# Patient Record
Sex: Female | Born: 1960 | Race: Black or African American | Hispanic: No | Marital: Married | Smoking: Former smoker
Health system: Southern US, Community
[De-identification: ages and names within clinical notes are randomized; demographics above are authoritative.]

## PROBLEM LIST (undated history)

## (undated) DIAGNOSIS — E039 Hypothyroidism, unspecified: Secondary | ICD-10-CM

## (undated) DIAGNOSIS — M79606 Pain in leg, unspecified: Secondary | ICD-10-CM

## (undated) DIAGNOSIS — M25551 Pain in right hip: Secondary | ICD-10-CM

## (undated) DIAGNOSIS — A048 Other specified bacterial intestinal infections: Secondary | ICD-10-CM

## (undated) HISTORY — DX: Hypothyroidism, unspecified: E03.9

## (undated) HISTORY — DX: Pain in right hip: M25.551

## (undated) HISTORY — DX: Pain in leg, unspecified: M79.606

---

## 2012-08-17 ENCOUNTER — Ambulatory Visit: Payer: Self-pay | Admitting: Internal Medicine

## 2015-04-02 ENCOUNTER — Encounter: Payer: Self-pay | Admitting: Urgent Care

## 2015-04-02 ENCOUNTER — Emergency Department: Payer: BLUE CROSS/BLUE SHIELD

## 2015-04-02 ENCOUNTER — Emergency Department
Admission: EM | Admit: 2015-04-02 | Discharge: 2015-04-03 | Disposition: A | Discharge status: Home / Self Care | Payer: BLUE CROSS/BLUE SHIELD | Attending: Emergency Medicine | Admitting: Emergency Medicine

## 2015-04-02 DIAGNOSIS — R52 Pain, unspecified: Secondary | ICD-10-CM

## 2015-04-02 DIAGNOSIS — R109 Unspecified abdominal pain: Secondary | ICD-10-CM

## 2015-04-02 DIAGNOSIS — R1013 Epigastric pain: Secondary | ICD-10-CM | POA: Diagnosis present

## 2015-04-02 HISTORY — DX: Other specified bacterial intestinal infections: A04.8

## 2015-04-02 LAB — COMPREHENSIVE METABOLIC PANEL
ALT: 15 U/L (ref 14–54)
AST: 21 U/L (ref 15–41)
Albumin: 3.6 g/dL (ref 3.5–5.0)
Alkaline Phosphatase: 102 U/L (ref 38–126)
Anion gap: 6 (ref 5–15)
BILIRUBIN TOTAL: 0.2 mg/dL — AB (ref 0.3–1.2)
BUN: 13 mg/dL (ref 6–20)
CALCIUM: 8.9 mg/dL (ref 8.9–10.3)
CO2: 25 mmol/L (ref 22–32)
Chloride: 111 mmol/L (ref 101–111)
Creatinine, Ser: 0.89 mg/dL (ref 0.44–1.00)
GFR calc Af Amer: >60 mL/min (ref >60)
GFR calc non Af Amer: >60 mL/min (ref >60)
GLUCOSE: 96 mg/dL (ref 65–99)
POTASSIUM: 4 mmol/L (ref 3.5–5.1)
SODIUM: 142 mmol/L (ref 135–145)
Total Protein: 7 g/dL (ref 6.5–8.1)

## 2015-04-02 LAB — CBC WITH DIFFERENTIAL/PLATELET
Basophils Absolute: 0 10*3/uL (ref 0–0.1)
Basophils Relative: 1 %
Eosinophils Absolute: 0.8 10*3/uL — ABNORMAL HIGH (ref 0–0.7)
Eosinophils Relative: 25 %
HEMATOCRIT: 38.8 % (ref 35.0–47.0)
Hemoglobin: 13 g/dL (ref 12.0–16.0)
LYMPHS ABS: 1 10*3/uL (ref 1.0–3.6)
Lymphocytes Relative: 31 %
MCH: 28.8 pg (ref 26.0–34.0)
MCHC: 33.4 g/dL (ref 32.0–36.0)
MCV: 86 fL (ref 80.0–100.0)
Monocytes Absolute: 0.2 10*3/uL (ref 0.2–0.9)
Monocytes Relative: 6 %
Neutro Abs: 1.2 10*3/uL — ABNORMAL LOW (ref 1.4–6.5)
Neutrophils Relative %: 37 %
PLATELETS: 217 10*3/uL (ref 150–440)
RBC: 4.52 MIL/uL (ref 3.80–5.20)
RDW: 13.2 % (ref 11.5–14.5)
WBC: 3.3 10*3/uL — ABNORMAL LOW (ref 3.6–11.0)

## 2015-04-02 LAB — URINALYSIS COMPLETE WITH MICROSCOPIC (ARMC ONLY)
BILIRUBIN URINE: NEGATIVE
Bacteria, UA: NONE SEEN
Glucose, UA: NEGATIVE mg/dL
HGB URINE DIPSTICK: NEGATIVE
KETONES UR: NEGATIVE mg/dL
Leukocytes, UA: NEGATIVE
Nitrite: NEGATIVE
Protein, ur: NEGATIVE mg/dL
RBC / HPF: NONE SEEN RBC/hpf (ref 0–5)
Specific Gravity, Urine: 1.012 (ref 1.005–1.030)
pH: 7 (ref 5.0–8.0)

## 2015-04-02 LAB — LIPASE, BLOOD: LIPASE: 52 U/L — AB (ref 22–51)

## 2015-04-02 MED ORDER — ONDANSETRON HCL 4 MG/2ML IJ SOLN
4.0000 mg | Freq: Once | INTRAMUSCULAR | Status: AC
  Administered 2015-04-02: 4 mg via INTRAVENOUS

## 2015-04-02 MED ORDER — ONDANSETRON HCL 4 MG/2ML IJ SOLN
INTRAMUSCULAR | Status: AC
  Administered 2015-04-02: 4 mg via INTRAVENOUS
  Filled 2015-04-02: qty 2

## 2015-04-02 MED ORDER — MORPHINE SULFATE 4 MG/ML IJ SOLN
INTRAMUSCULAR | Status: AC
  Administered 2015-04-02: 4 mg via INTRAVENOUS
  Filled 2015-04-02: qty 1

## 2015-04-02 MED ORDER — GI COCKTAIL ~~LOC~~
30.0000 mL | Freq: Once | ORAL | Status: AC
  Administered 2015-04-03: 30 mL via ORAL

## 2015-04-02 MED ORDER — MORPHINE SULFATE 4 MG/ML IJ SOLN
4.0000 mg | Freq: Once | INTRAMUSCULAR | Status: AC
  Administered 2015-04-02: 4 mg via INTRAVENOUS

## 2015-04-02 NOTE — ED Notes (Signed)
Patient presents with c/o diffuse abd pain that started at around 1800. Patient reporting PMH significant for H.pylori - was seen by PCP today and was Rx'd Zantac. Patient denies N/V/D/F - states, "I feel like if I could just vomit I would feel better." Patient tearful in triage and unable to sit down; pacing around room in obvious pain.

## 2015-04-02 NOTE — ED Provider Notes (Signed)
[facility] Emergency Department Provider Note  ____________________________________________  Time seen: 2210  I have reviewed the triage vital signs and the nursing notes.   HISTORY  Chief Complaint Abdominal Pain   History limited by: Not Limited   HPI Dana Rosales is a 54 y.o. female who presents to the emergency department with abdominal pain. It is located in the epigastric region. It is severe. It does not radiate. It started this afternoon. Patient has had similar pain in the past. Her episodes of pain have been intermittent. She has been seen by GI and her primary care doctor for this. Patient states that she underwent a CT abdomen and pelvis in the past couple of days which was negative. The patient states she also has undergone a course for antibiotics for H. Pylori however there is some concern by her GI doctor that this might of been an antibiotic failure. The patient denies any recent fevers.     Past Medical History  Diagnosis Date  . H. pylori infection     There are no active problems to display for this patient.   No past surgical history on file.  No current outpatient prescriptions on file.  Allergies Review of patient's allergies indicates not on file.  No family history on file.  Social History History  Substance Use Topics  . Smoking status: Never Smoker   . Smokeless tobacco: Not on file  . Alcohol Use: No    Review of Systems  Constitutional: Negative for fever. Cardiovascular: Negative for chest pain. Respiratory: Negative for shortness of breath. Gastrointestinal: epigastric abdominal pain Genitourinary: Negative for dysuria. Musculoskeletal: Negative for back pain. Skin: Negative for rash. Neurological: Negative for headaches, focal weakness or numbness.   10-point ROS otherwise negative.  ____________________________________________   PHYSICAL EXAM:  VITAL SIGNS: ED Triage Vitals  Enc Vitals  Group     BP 04/02/15 2157 153/85 mmHg     Pulse Rate 04/02/15 2157 61     Resp 04/02/15 2157 18     Temp 04/02/15 2157 98.3 F (36.8 C)     Temp Source 04/02/15 2157 Oral     SpO2 04/02/15 2157 96 %     Weight 04/02/15 2157 155 lb (70.308 kg)     Height 04/02/15 2157  (1.676 m)     Head Cir --      Peak Flow --      Pain Score 04/02/15 2158 10   Constitutional: Alert and oriented. Appears in moderate distress Eyes: Conjunctivae are normal. PERRL. Normal extraocular movements. ENT   Head: Normocephalic and atraumatic.   Nose: No congestion/rhinnorhea.   Mouth/Throat: Mucous membranes are moist.   Neck: No stridor. Hematological/Lymphatic/Immunilogical: No cervical lymphadenopathy. Cardiovascular: Normal rate, regular rhythm.  No murmurs, rubs, or gallops. Respiratory: Normal respiratory effort without tachypnea nor retractions. Breath sounds are clear and equal bilaterally. No wheezes/rales/rhonchi. Gastrointestinal: Soft and minimally tender to palpation in the epigastric region Genitourinary: Deferred Musculoskeletal: Normal range of motion in all extremities. No joint effusions.  No lower extremity tenderness nor edema. Neurologic:  Normal speech and language. No gross focal neurologic deficits are appreciated. Speech is normal.  Skin:  Skin is warm, dry and intact. No rash noted. Psychiatric: Mood and affect are normal. Speech and behavior are normal. Patient exhibits appropriate insight and judgment.  ____________________________________________    LABS (pertinent positives/negatives)  Labs Reviewed  CBC WITH DIFFERENTIAL/PLATELET - Abnormal; Notable for the following:    WBC 3.3 (*)  Neutro Abs 1.2 (*)    Eosinophils Absolute 0.8 (*)    All other components within normal limits  COMPREHENSIVE METABOLIC PANEL - Abnormal; Notable for the following:    Total Bilirubin 0.2 (*)    All other components within normal limits  LIPASE, BLOOD - Abnormal;  Notable for the following:    Lipase 52 (*)    All other components within normal limits  URINALYSIS COMPLETEWITH MICROSCOPIC ([facility] ONLY)     ____________________________________________   EKG  None  ____________________________________________    RADIOLOGY  Ultrasound of the right upper quadrant pending at time of sign out  ____________________________________________   PROCEDURES  Procedure(s) performed: None  Critical Care performed: No  ____________________________________________   INITIAL IMPRESSION / ASSESSMENT AND PLAN / ED COURSE  Pertinent labs & imaging results that were available during my care of the patient were reviewed by me and considered in my medical decision making (see chart for details).  Patient presents with epigastric abdominal pain. She has seen GI for this, past and recently underwent a negative CT scan. It appears that patient has not undergone an ultrasound of her gallbladder. At this point think either gallbladder disease or worsening peptic cultures disease most likely culprit. Will obtain a right upper quadrant ultrasound.  ____________________________________________   FINAL CLINICAL IMPRESSION(S) / ED DIAGNOSES  Final diagnoses:  Pain  Abdominal pain, unspecified abdominal location     Phineas Semen, MD 04/02/15 2350

## 2015-04-03 MED ORDER — SUCRALFATE 1 G PO TABS: 1.0000 g | ORAL_TABLET | Freq: Four times a day (QID) | ORAL | Status: DC

## 2015-04-03 MED ORDER — GI COCKTAIL ~~LOC~~
ORAL | Status: AC
  Administered 2015-04-03: 30 mL via ORAL
  Filled 2015-04-03: qty 30

## 2015-04-03 NOTE — Discharge Instructions (Signed)
Please seek medical attention for any high fevers, chest pain, shortness of breath, change in behavior, persistent vomiting, bloody stool or any other new or concerning symptoms. ° °Abdominal Pain, Women °Abdominal (stomach, pelvic, or belly) pain can be caused by many things. It is important to tell your doctor: °· The location of the pain. °· Does it come and go or is it present all the time? °· Are there things that start the pain (eating certain foods, exercise)? °· Are there other symptoms associated with the pain (fever, nausea, vomiting, diarrhea)? °All of this is helpful to know when trying to find the cause of the pain. °CAUSES  °· Stomach: virus or bacteria infection, or ulcer. °· Intestine: appendicitis (inflamed appendix), regional ileitis (Crohn's disease), ulcerative colitis (inflamed colon), irritable bowel syndrome, diverticulitis (inflamed diverticulum of the colon), or cancer of the stomach or intestine. °· Gallbladder disease or stones in the gallbladder. °· Kidney disease, kidney stones, or infection. °· Pancreas infection or cancer. °· Fibromyalgia (pain disorder). °· Diseases of the female organs: °¨ Uterus: fibroid (non-cancerous) tumors or infection. °¨ Fallopian tubes: infection or tubal pregnancy. °¨ Ovary: cysts or tumors. °¨ Pelvic adhesions (scar tissue). °¨ Endometriosis (uterus lining tissue growing in the pelvis and on the pelvic organs). °¨ Pelvic congestion syndrome (female organs filling up with blood just before the menstrual period). °¨ Pain with the menstrual period. °¨ Pain with ovulation (producing an egg). °¨ Pain with an IUD (intrauterine device, birth control) in the uterus. °¨ Cancer of the female organs. °· Functional pain (pain not caused by a disease, may improve without treatment). °· Psychological pain. °· Depression. °DIAGNOSIS  °Your doctor will decide the seriousness of your pain by doing an examination. °· Blood tests. °· X-rays. °· Ultrasound. °· CT scan  (computed tomography, special type of X-ray). °· MRI (magnetic resonance imaging). °· Cultures, for infection. °· Barium enema (dye inserted in the large intestine, to better view it with X-rays). °· Colonoscopy (looking in intestine with a lighted tube). °· Laparoscopy (minor surgery, looking in abdomen with a lighted tube). °· Major abdominal exploratory surgery (looking in abdomen with a large incision). °TREATMENT  °The treatment will depend on the cause of the pain.  °· Many cases can be observed and treated at home. °· Over-the-counter medicines recommended by your caregiver. °· Prescription medicine. °· Antibiotics, for infection. °· Birth control pills, for painful periods or for ovulation pain. °· Hormone treatment, for endometriosis. °· Nerve blocking injections. °· Physical therapy. °· Antidepressants. °· Counseling with a psychologist or psychiatrist. °· Minor or major surgery. °HOME CARE INSTRUCTIONS  °· Do not take laxatives, unless directed by your caregiver. °· Take over-the-counter pain medicine only if ordered by your caregiver. Do not take aspirin because it can cause an upset stomach or bleeding. °· Try a clear liquid diet (broth or water) as ordered by your caregiver. Slowly move to a bland diet, as tolerated, if the pain is related to the stomach or intestine. °· Have a thermometer and take your temperature several times a day, and record it. °· Bed rest and sleep, if it helps the pain. °· Avoid sexual intercourse, if it causes pain. °· Avoid stressful situations. °· Keep your follow-up appointments and tests, as your caregiver orders. °· If the pain does not go away with medicine or surgery, you may try: °¨ Acupuncture. °¨ Relaxation exercises (yoga, meditation). °¨ Group therapy. °¨ Counseling. °SEEK MEDICAL CARE IF:  °· You notice certain foods cause stomach   pain. °· Your home care treatment is not helping your pain. °· You need stronger pain medicine. °· You want your IUD removed. °· You  feel faint or lightheaded. °· You develop nausea and vomiting. °· You develop a rash. °· You are having side effects or an allergy to your medicine. °SEEK IMMEDIATE MEDICAL CARE IF:  °· Your pain does not go away or gets worse. °· You have a fever. °· Your pain is felt only in portions of the abdomen. The right side could possibly be appendicitis. The left lower portion of the abdomen could be colitis or diverticulitis. °· You are passing blood in your stools (bright red or black tarry stools, with or without vomiting). °· You have blood in your urine. °· You develop chills, with or without a fever. °· You pass out. °MAKE SURE YOU:  °· Understand these instructions. °· Will watch your condition. °· Will get help right away if you are not doing well or get worse. °Document Released: 07/26/2007 Document Revised: 02/12/2014 Document Reviewed: 08/15/2009 °[employer]® Patient Information ©2015 [employer]. This information is not intended to replace advice given to you by your health care provider. Make sure you discuss any questions you have with your health care provider. ° °

## 2015-05-07 ENCOUNTER — Other Ambulatory Visit: Payer: Self-pay | Admitting: Internal Medicine

## 2015-05-07 DIAGNOSIS — R1013 Epigastric pain: Secondary | ICD-10-CM

## 2015-05-10 ENCOUNTER — Ambulatory Visit: Payer: BLUE CROSS/BLUE SHIELD

## 2015-05-17 ENCOUNTER — Ambulatory Visit
Admission: RE | Admit: 2015-05-17 | Discharge: 2015-05-17 | Disposition: A | Discharge status: Home / Self Care | Payer: BLUE CROSS/BLUE SHIELD | Source: Ambulatory Visit | Attending: Internal Medicine | Admitting: Internal Medicine

## 2015-05-17 DIAGNOSIS — R1013 Epigastric pain: Secondary | ICD-10-CM | POA: Diagnosis present

## 2015-05-17 DIAGNOSIS — I700 Atherosclerosis of aorta: Secondary | ICD-10-CM | POA: Diagnosis not present

## 2018-02-19 ENCOUNTER — Encounter: Payer: Self-pay | Admitting: Emergency Medicine

## 2018-02-19 ENCOUNTER — Emergency Department
Admission: EM | Admit: 2018-02-19 | Discharge: 2018-02-19 | Disposition: A | Discharge status: Home / Self Care | Payer: BLUE CROSS/BLUE SHIELD | Attending: Emergency Medicine | Admitting: Emergency Medicine

## 2018-02-19 DIAGNOSIS — K0889 Other specified disorders of teeth and supporting structures: Secondary | ICD-10-CM

## 2018-02-19 DIAGNOSIS — Z79899 Other long term (current) drug therapy: Secondary | ICD-10-CM | POA: Insufficient documentation

## 2018-02-19 MED ORDER — LIDOCAINE VISCOUS 2 % MT SOLN: 10.0000 mL | Freq: Four times a day (QID) | OROMUCOSAL | 0 refills | Status: DC | PRN

## 2018-02-19 MED ORDER — PENICILLIN V POTASSIUM 500 MG PO TABS: 500.0000 mg | ORAL_TABLET | Freq: Four times a day (QID) | ORAL | 0 refills | Status: AC

## 2018-02-19 NOTE — Discharge Instructions (Addendum)
Take medication as prescribed. Rest. Drink plenty of fluids.  Take over-the-counter Tylenol or ibuprofen as discussed.  Follow-up with your dentist on Monday.  Follow up with your primary care physician this week as needed. Return to Emergency room for new or worsening concerns.

## 2018-02-19 NOTE — ED Provider Notes (Signed)
[facility] Emergency Department Time seen: Approximately 11:36 AM  I have reviewed the triage vital signs and the nursing notes.   HISTORY  Chief Complaint Dental Pain    HPI Coletta Mcginnis is a 57 y.o. female presenting for evaluation of left lower dental pain present since yesterday.  Patient reports approximately 1 month ago she was last seen by her dentist and was informed that she was having a dental issue to that tooth, reports she had some tenderness then.  However she then had an appointment to see another dentist for an extraction of the tooth, but reported the pain resolved so she did not follow through.  States that tooth has a crown, and needs now to be extracted. Reports yesterday the pain started, without known trigger. States mild to moderate pain.  States when she lays down it is worse and it is throbbing.  No over-the-counter medications taken for the same complaints.  Has continued to eat and drink well.  Denies any other accompanying symptoms.  Denies fevers, cough, congestion, sore throat, chest pain, shortness of breath or other complaints.  Reports otherwise feels well.   Past Medical History:  Diagnosis Date  . H. pylori infection     There are no active problems to display for this patient.   History reviewed. No pertinent surgical history.  Current Outpatient Rx  . Order #: [id number] Class: Historical Med  . Order #: [id number] Class: Print  . Order #: [id number] Class: Print  . Order #: [id number] Class: Print    Allergies Patient has no known allergies.  No family history on file.  Social History Social History   Tobacco Use  . Smoking status: Never Smoker  Substance Use Topics  . Alcohol use: No  . Drug use: No    Review of Systems Constitutional: No fever/chills. Reports continues to eat and drink foods and fluids well.  ENT: No sore throat. As above.  Cardiovascular: Denies chest pain. Respiratory: Denies shortness of  breath. Gastrointestinal: No abdominal pain.  No nausea, no vomiting.  Genitourinary: Negative for dysuria. Musculoskeletal: Negative for back pain. Skin: Negative for rash.   ____________________________________________   PHYSICAL EXAM:  VITAL SIGNS: ED Triage Vitals  Enc Vitals Group     BP 02/19/18 1102 140/86     Pulse Rate 02/19/18 1102 67     Resp 02/19/18 1102 20     Temp 02/19/18 1102 98.8 F (37.1 C)     Temp Source 02/19/18 1102 Oral     SpO2 02/19/18 1102 99 %     Weight 02/19/18 1103 150 lb (68 kg)     Height 02/19/18 1103  (1.702 m)     Head Circumference --      Peak Flow --      Pain Score 02/19/18 1103 1     Pain Loc --      Pain Edu? --      Excl. in GC? --     Constitutional: Alert and oriented. Well appearing and in no acute distress. Eyes: Conjunctivae are normal.  Head: Atraumatic.No facial swelling noted.  Ears: Bilateral ears no erythema, normal TMs.  Nose: No congestion/rhinnorhea. Mouth/Throat: Mucous membranes are moist.  Oropharynx non-erythematous. Periodontal Exam   Tenderness along the gumline adjacent to teeth 18 and 19 with associated gum erythema and mild localized swelling, no fractured tooth noted in above area, no clear abscess, but appearance of aphthous-like ulcer also present and gum swelling, no fluctuance, mild to moderate direct tenderness to palpation.  No other gumline tenderness noted. Neck: No stridor.  Hematological/Lymphatic/Immunilogical: No cervical lymphadenopathy. Cardiovascular:   Normal rate, regular rhythm. Grossly normal heart sounds. Good peripheral circulation. Respiratory: Normal respiratory effort.  No retractions. No wheezes, rales or rhonchi. Musculoskeletal: Steady gait. Neurologic:  Normal speech and language. Speech is normal. No gait instability. Skin:  Skin is warm, dry and intact. No rash noted. Psychiatric: Mood and affect are normal. Speech and behavior are  normal.  ____________________________________________   LABS (all labs ordered are listed, but only abnormal results are displayed)  Labs Reviewed - No data to display ____________________________________________   INITIAL IMPRESSION / ASSESSMENT AND PLAN / ED COURSE  Pertinent labs & imaging results that were available during my care of the patient were reviewed by me and considered in my medical decision making (see chart for details).  Well-appearing patient.  No acute distress.  Suspect left lower dental infection.  Will treat with pen VK, over-the-counter Tylenol and ibuprofen as well as viscous lidocaine.  Encourage soft food diet, and follow-up closely with dentist, call Monday.  Encourage rest, fluids.Discussed indication, risks and benefits of medications with patient.   Also advised to take the antibiotic until finished. Instructed to return to the Urgent Care or ER  for symptoms that change or worsen or if unable to schedule an appointment. ____________________________________________   FINAL CLINICAL IMPRESSION(S) / ED DIAGNOSES  Final diagnoses:  Pain, dental         Renford Dills, NP 02/19/18 1217    Jene Every, MD 02/19/18 1356

## 2018-02-19 NOTE — ED Notes (Signed)
Pt verbalizes understanding of d/c instructions, medications and f/u 

## 2018-02-19 NOTE — ED Triage Notes (Signed)
Pt reports toothache to bottom left jaw for the past 2 days.

## 2018-11-10 ENCOUNTER — Encounter: Payer: Self-pay | Admitting: Neurology

## 2018-11-10 ENCOUNTER — Ambulatory Visit: Payer: BLUE CROSS/BLUE SHIELD | Admitting: Neurology

## 2018-11-10 VITALS — Ht 67.0 in | Wt 149.5 lb

## 2018-11-10 DIAGNOSIS — M5416 Radiculopathy, lumbar region: Secondary | ICD-10-CM

## 2018-11-10 MED ORDER — GABAPENTIN 300 MG PO CAPS: 300.0000 mg | ORAL_CAPSULE | Freq: Three times a day (TID) | ORAL | 11 refills | Status: AC

## 2018-11-10 MED ORDER — ALPRAZOLAM 0.5 MG PO TABS: 0.5000 mg | ORAL_TABLET | Freq: Every evening | ORAL | 0 refills | Status: AC | PRN

## 2018-11-10 NOTE — Progress Notes (Signed)
PATIENT: Dana Rosales DOB: 06/29/1961  Chief Complaint  Patient presents with  . Pain    Reports a gradual a worsening right hip and bilateral leg pain.  Feels the pain is worse when she is doing some of her yoga poses and standing from low chairs.  She is using gabapentin 100mg , two capsules TID which has lessened her discomfort.   Marland Kitchen. PCP    Kristen LoaderFurr, Tor NettersSara M., MD     HISTORICAL  Dana Rosales is a 58 year old female, seen in request by her primary care physician Dr. Ninetta LightsFurr, Sara for evaluation of right hip pain, bilateral lower extremity pain, initial evaluation was on November 10, 2018.  I have reviewed and summarized the referring note from the referring physician.  She had a history of hypothyroidism, is on supplement, she noted low back pain since October 2019, after practicing yoga, she noticed right side radiating pain to right hip, to the bottom of her right foot, feel like electric current, lasting 15 to 20 minutes, then gradually improved, most pain was at the right side, occasionally left side pain, she also complains of back pressure, but denies significant pain, she has been taking gabapentin 100 mg 2 tablets 3 times a day which has been helpful,  She denies significant gait abnormality, no bowel and bladder incontinence.  REVIEW OF SYSTEMS: Full 14 system review of systems performed and notable only for as above All other review of systems were negative.  ALLERGIES: Allergies  Allergen Reactions  . Codeine Nausea And Vomiting    Other reaction(s): GI Upset (intolerance)    HOME MEDICATIONS: Current Outpatient Medications  Medication Sig Dispense Refill  . gabapentin (NEURONTIN) 100 MG capsule Take by mouth.    . levothyroxine (SYNTHROID, LEVOTHROID) 75 MCG tablet Take 75 mcg by mouth daily before breakfast.     No current facility-administered medications for this visit.     PAST MEDICAL HISTORY: Past Medical History:  Diagnosis Date  . H. pylori infection   .  Hypothyroid   . Leg pain    bilateral  . Right hip pain     PAST SURGICAL HISTORY: History reviewed. No pertinent surgical history.  FAMILY HISTORY: Family History  Problem Relation Age of Onset  . Hypertension Mother   . Colon cancer Father   . Diabetes Father     SOCIAL HISTORY: Social History   Socioeconomic History  . Marital status: Married    Spouse name: Not on file  . Number of children: 0  . Years of education: some college  . Highest education level: Not on file  Occupational History  . Occupation: compliance analyst  Social Needs  . Financial resource strain: Not on file  . Food insecurity:    Worry: Not on file    Inability: Not on file  . Transportation needs:    Medical: Not on file    Non-medical: Not on file  Tobacco Use  . Smoking status: Former Games developermoker  . Smokeless tobacco: Never Used  Substance and Sexual Activity  . Alcohol use: Yes    Comment: occasional  . Drug use: No  . Sexual activity: Not on file  Lifestyle  . Physical activity:    Days per week: Not on file    Minutes per session: Not on file  . Stress: Not on file  Relationships  . Social connections:    Talks on phone: Not on file    Gets together: Not on file    Attends religious  service: Not on file    Active member of club or organization: Not on file    Attends meetings of clubs or organizations: Not on file    Relationship status: Not on file  . Intimate partner violence:    Fear of current or ex partner: Not on file    Emotionally abused: Not on file    Physically abused: Not on file    Forced sexual activity: Not on file  Other Topics Concern  . Not on file  Social History Narrative   Lives at home with husband.   Right-handed.    No caffeine use.     PHYSICAL EXAM   Vitals:   11/10/18 0938  Weight: 149 lb 8 oz (67.8 kg)  Height: 5\' 7"  (1.702 m)    Not recorded      Body mass index is 23.42 kg/m.  PHYSICAL EXAMNIATION:  Gen: NAD, conversant, well  nourised, obese, well groomed                     Cardiovascular: Regular rate rhythm, no peripheral edema, warm, nontender. Eyes: Conjunctivae clear without exudates or hemorrhage Neck: Supple, no carotid bruits. Pulmonary: Clear to auscultation bilaterally   NEUROLOGICAL EXAM:  MENTAL STATUS: Speech:    Speech is normal; fluent and spontaneous with normal comprehension.  Cognition:     Orientation to time, place and person     Normal recent and remote memory     Normal Attention span and concentration     Normal Language, naming, repeating,spontaneous speech     Fund of knowledge   CRANIAL NERVES: CN II: Visual fields are full to confrontation. Fundoscopic exam is normal with sharp discs and no vascular changes. Pupils are round equal and briskly reactive to light. CN III, IV, VI: extraocular movement are normal. No ptosis. CN V: Facial sensation is intact to pinprick in all 3 divisions bilaterally. Corneal responses are intact.  CN VII: Face is symmetric with normal eye closure and smile. CN VIII: Hearing is normal to rubbing fingers CN IX, X: Palate elevates symmetrically. Phonation is normal. CN XI: Head turning and shoulder shrug are intact CN XII: Tongue is midline with normal movements and no atrophy.  MOTOR: There is no pronator drift of out-stretched arms. Muscle bulk and tone are normal. Muscle strength is normal.  REFLEXES: Reflexes are 2+ and symmetric at the biceps, triceps, knees, and ankles. Plantar responses are flexor.  SENSORY: Intact to light touch, pinprick, positional sensation and vibratory sensation are intact in fingers and toes.  COORDINATION: Rapid alternating movements and fine finger movements are intact. There is no dysmetria on finger-to-nose and heel-knee-shin.    GAIT/STANCE: Posture is normal. Gait is steady with normal steps, base, arm swing, and turning. Heel and toe walking are normal. Tandem gait is normal.  Romberg is  absent.   DIAGNOSTIC DATA (LABS, IMAGING, TESTING) - I reviewed patient records, labs, notes, testing and imaging myself where available.   ASSESSMENT AND PLAN  Dana Rosales is a 58 y.o. female   Right low back radiating pain  Most suggestive right lumbosacral radiculopathy  Proceed with MRI of lumbar spine  Higher dose of gabapentin 300 mg 3 times daily, may combine it with NSAIDs as needed  Levert FeinsteinYijun Naly Schwanz, M.D. Ph.D.  [facility] [address], [address] [place], [place] [address] Ph: [phone] Fax: [phone]  CC: Laqueta DueFurr, Sara M., MD

## 2018-11-15 ENCOUNTER — Telehealth: Payer: Self-pay | Admitting: Neurology

## 2018-11-15 NOTE — Telephone Encounter (Signed)
Patient returned my call she is scheduled for 11/16/18 at [facility].

## 2018-11-15 NOTE — Telephone Encounter (Signed)
lvm for pt to call back about scheduling mri   [employer] Auth: NPR Spoke to [place] Ref # [id number]

## 2018-11-16 ENCOUNTER — Ambulatory Visit: Payer: BLUE CROSS/BLUE SHIELD

## 2018-11-16 DIAGNOSIS — M5416 Radiculopathy, lumbar region: Secondary | ICD-10-CM | POA: Diagnosis not present

## 2018-11-18 ENCOUNTER — Other Ambulatory Visit: Payer: Self-pay | Admitting: *Deleted

## 2018-11-18 ENCOUNTER — Telehealth: Payer: Self-pay | Admitting: Neurology

## 2018-11-18 DIAGNOSIS — M5416 Radiculopathy, lumbar region: Secondary | ICD-10-CM

## 2018-11-18 MED ORDER — DULOXETINE HCL 60 MG PO CPEP: 60.0000 mg | ORAL_CAPSULE | Freq: Every day | ORAL | 3 refills | Status: AC

## 2018-11-18 NOTE — Telephone Encounter (Addendum)
Per vo by Dr. Terrace Arabia, next steps would be NCV/EMG, pain management (for possible ESI), duloxetine 60mg  once daily, only use gabapentin 300mg , one capsule at bedtime.  The patient is agreeable to the medication changes and NCV/EMG.  She would like to hold off on pain management for right now.  She is aware to expect a all to get her NCV/EMG scheduled (order in Epic).  She will pick up the duloxetine rx today.

## 2018-11-18 NOTE — Addendum Note (Signed)
Addended by: Lindell Spar C on: 11/18/2018 12:24 PM   Modules accepted: Orders

## 2018-11-18 NOTE — Telephone Encounter (Addendum)
Spoke to patient.  She is aware of results and verbalized understanding.  States the gabapentin 300mg , one capsule TID is not controlling her pain.  The daytime doses are causing her excessive drowsiness and she is not able to tolerate it.

## 2018-11-18 NOTE — Telephone Encounter (Signed)
Please call patient, MRI of lumbar spine showed mild degenerative changes, there is no evidence of significant canal or foraminal stenosis.    IMPRESSION:   Abnormal MRI lumbar spine (without) demonstrating: - At L4-5: pseudo-disc bulging with mild spinal stenosis and moderate biforaminal stenosis; grade 2 spondylolisthesis of L4 anterior to L5 measuring 4mm. - At L2-3: disc bulging with mild biforaminal stenosis. - At L3-4: disc bulging with mild biforaminal stenosis.

## 2018-12-29 ENCOUNTER — Telehealth: Payer: Self-pay

## 2018-12-29 NOTE — Telephone Encounter (Signed)
I called and left a message for patient to call the office in regards to her appointment tomorrow. Please ask the patient the standard Covid-19 questions. If negative, please ask if she would prefer to reschedule her appointment to a later date.

## 2018-12-29 NOTE — Telephone Encounter (Signed)
Pt isnt having symptoms of virus, but having pain in her legs

## 2018-12-30 ENCOUNTER — Ambulatory Visit (INDEPENDENT_AMBULATORY_CARE_PROVIDER_SITE_OTHER): Payer: BLUE CROSS/BLUE SHIELD | Admitting: Neurology

## 2018-12-30 ENCOUNTER — Other Ambulatory Visit: Payer: Self-pay

## 2018-12-30 DIAGNOSIS — R202 Paresthesia of skin: Secondary | ICD-10-CM

## 2018-12-30 DIAGNOSIS — M79604 Pain in right leg: Secondary | ICD-10-CM

## 2018-12-30 DIAGNOSIS — M79605 Pain in left leg: Secondary | ICD-10-CM | POA: Diagnosis not present

## 2018-12-30 DIAGNOSIS — M5416 Radiculopathy, lumbar region: Secondary | ICD-10-CM

## 2018-12-30 MED ORDER — MELOXICAM 7.5 MG PO TABS: 7.5000 mg | ORAL_TABLET | Freq: Every day | ORAL | 11 refills | Status: AC

## 2018-12-30 NOTE — Procedures (Signed)
Full Name: Floella Broman Gender: Female MRN #: [id number] Date of Birth: March 17, 1961    Visit Date: 12/30/2018 08:09 Age: 58 Years 5 Months Old Examining Physician: Levert Feinstein, MD  Referring Physician: Levert Feinstein, MD History: 59 year old female presented with bilateral lateral calf radiating pain  Summary of the tests:  Nerve conduction study: Bilateral sural, superficial peroneal sensory responses were normal.  Bilateral tibial, peroneal to EDB motor responses were normal.  Electromyography: Selected needle examinations of right lower extremity muscles and right lumbosacral paraspinals were normal.  Conclusion: This is a normal study.  There is no electrodiagnostic evidence of peripheral neuropathy or right lumbosacral radiculopathy.    ------------------------------- Levert Feinstein, M.D.Ph.D.  [facility] [address] [place], [place] [address] Tel: [phone] Fax: [phone]        [facility]    Nerve / Sites Muscle Latency Ref. Amplitude Ref. Rel Amp Segments Distance Velocity Ref. Area    ms ms mV mV %  cm m/s m/s mVms  R Peroneal - EDB     Ankle EDB 5.2 ?6.5 4.5 ?2.0 100 Ankle - EDB 9   13.3     Fib head EDB 10.7  4.5  101 Fib head - Ankle 28 50 ?44 13.8     Pop fossa EDB 12.8  4.3  95.4 Pop fossa - Fib head 10 49 ?44 13.3         Pop fossa - Ankle      L Peroneal - EDB     Ankle EDB 4.6 ?6.5 6.3 ?2.0 100 Ankle - EDB 9   19.7     Fib head EDB 10.0  5.7  91.7 Fib head - Ankle 28 52 ?44 19.2     Pop fossa EDB 12.0  5.6  97.2 Pop fossa - Fib head 10 51 ?44 18.9         Pop fossa - Ankle      R Tibial - AH     Ankle AH 3.8 ?5.8 7.9 ?4.0 100 Ankle - AH 9   20.5     Pop fossa AH 12.6  6.1  76.5 Pop fossa - Ankle 36 41 ?41 19.3  L Tibial - AH     Ankle AH 4.5 ?5.8 7.8 ?4.0 100 Ankle - AH 9   17.3     Pop fossa AH 12.7  5.9  75.1 Pop fossa - Ankle 35 43 ?41 14.8             SNC    Nerve / Sites Rec. Site Peak Lat Ref.  Amp Ref. Segments Distance     ms ms V V  cm  R Sural - Ankle (Calf)     Calf Ankle 3.2 ?4.4 17 ?6 Calf - Ankle 14  L Sural - Ankle (Calf)     Calf Ankle 3.3 ?4.4 16 ?6 Calf - Ankle 14  R Superficial peroneal - Ankle     Lat leg Ankle 3.8 ?4.4 12 ?6 Lat leg - Ankle 14  L Superficial peroneal - Ankle     Lat leg Ankle 3.5 ?4.4 7 ?6 Lat leg - Ankle 14              F  Wave    Nerve F Lat Ref.   ms ms  R Tibial - AH 52.0 ?56.0  L Tibial - AH 51.8 ?56.0         EMG  EMG Summary Table    Spontaneous MUAP Recruitment  Muscle IA Fib PSW Fasc Other Amp Dur. Poly Pattern  R. Tibialis anterior Normal None None None _______ Normal Normal Normal Normal  R. Tibialis posterior Normal None None None _______ Normal Normal Normal Normal  R. Peroneus longus Normal None None None _______ Normal Normal Normal Normal  R. Gastrocnemius (Medial head) Normal None None None _______ Normal Normal Normal Normal  R. Vastus lateralis Normal None None None _______ Normal Normal Normal Normal  R. Biceps femoris (short head) Normal None None None _______ Normal Normal Normal Normal  R. Lumbar paraspinals (mid) Normal None None None _______ Normal Normal Normal Normal  R. Lumbar paraspinals (low) Normal None None None _______ Normal Normal Normal Normal

## 2018-12-30 NOTE — Progress Notes (Signed)
PATIENT: Dana Rosales DOB: Oct 09, 1961  No chief complaint on file.    HISTORICAL  Dana Rosales is a 58 year old female, seen in request by her primary care physician Dr. Ninetta Lights for evaluation of right hip pain, bilateral lower extremity pain, initial evaluation was on November 10, 2018.  I have reviewed and summarized the referring note from the referring physician.  She had a history of hypothyroidism, is on supplement, she noted low back pain since October 2019, after practicing yoga, she noticed right side radiating pain to right hip, to the bottom of her right foot, feel like electric current, lasting 15 to 20 minutes, then gradually improved, most pain was at the right side, occasionally left side pain, she also complains of back pressure, but denies significant pain, she has been taking gabapentin 100 mg 2 tablets 3 times a day which has been helpful,  She denies significant gait abnormality, no bowel and bladder incontinence.  UPDATE December 30 2018: She continue to complain significant bilateral lateral calf area numbness tingling electric shooting pain, is in tears today,  EMG nerve conduction study is normal, there is no evidence of lower extremity neuropathy or right lumbosacral radiculopathy.  She denies gait abnormality, bowel and bladder incontinence, no significant neck pain no upper extremity paresthesia or weakness  We personally reviewed MRI of lumbar in February 2020, at L4-5, pseudo-disc bulging with mild spinal stenosis in the moderate by foraminal stenosis, grade 2 spondylolisthesis of L4 anterior to L5 measuring 10 mm, variable degree of foraminal narrowing,   REVIEW OF SYSTEMS: Full 14 system review of systems performed and notable only for as above All other review of systems were negative.  ALLERGIES: Allergies  Allergen Reactions  . Codeine Nausea And Vomiting    Other reaction(s): GI Upset (intolerance)    HOME MEDICATIONS: Current Outpatient  Medications  Medication Sig Dispense Refill  . ALPRAZolam (XANAX) 0.5 MG tablet Take 1 tablet (0.5 mg total) by mouth at bedtime as needed for anxiety. Take 1-2 tablets 30 minutes prior to MRI, may repeat once as needed. Must have driver. 3 tablet 0  . DULoxetine (CYMBALTA) 60 MG capsule Take 1 capsule (60 mg total) by mouth daily. 30 capsule 3  . gabapentin (NEURONTIN) 300 MG capsule Take 1 capsule (300 mg total) by mouth 3 (three) times daily. 90 capsule 11  . levothyroxine (SYNTHROID, LEVOTHROID) 75 MCG tablet Take 75 mcg by mouth daily before breakfast.    . meloxicam (MOBIC) 7.5 MG tablet Take 1 tablet (7.5 mg total) by mouth daily. 30 tablet 11   No current facility-administered medications for this visit.     PAST MEDICAL HISTORY: Past Medical History:  Diagnosis Date  . H. pylori infection   . Hypothyroid   . Leg pain    bilateral  . Right hip pain     PAST SURGICAL HISTORY: No past surgical history on file.  FAMILY HISTORY: Family History  Problem Relation Age of Onset  . Hypertension Mother   . Colon cancer Father   . Diabetes Father     SOCIAL HISTORY: Social History   Socioeconomic History  . Marital status: Married    Spouse name: Not on file  . Number of children: 0  . Years of education: some college  . Highest education level: Not on file  Occupational History  . Occupation: compliance analyst  Social Needs  . Financial resource strain: Not on file  . Food insecurity:    Worry: Not on  file    Inability: Not on file  . Transportation needs:    Medical: Not on file    Non-medical: Not on file  Tobacco Use  . Smoking status: Former Games developer  . Smokeless tobacco: Never Used  Substance and Sexual Activity  . Alcohol use: Yes    Comment: occasional  . Drug use: No  . Sexual activity: Not on file  Lifestyle  . Physical activity:    Days per week: Not on file    Minutes per session: Not on file  . Stress: Not on file  Relationships  . Social  connections:    Talks on phone: Not on file    Gets together: Not on file    Attends religious service: Not on file    Active member of club or organization: Not on file    Attends meetings of clubs or organizations: Not on file    Relationship status: Not on file  . Intimate partner violence:    Fear of current or ex partner: Not on file    Emotionally abused: Not on file    Physically abused: Not on file    Forced sexual activity: Not on file  Other Topics Concern  . Not on file  Social History Narrative   Lives at home with husband.   Right-handed.    No caffeine use.     PHYSICAL EXAM   There were no vitals filed for this visit.  Not recorded      There is no height or weight on file to calculate BMI.  PHYSICAL EXAMNIATION:  Gen: NAD, conversant, well nourised, obese, well groomed                     Cardiovascular: Regular rate rhythm, no peripheral edema, warm, nontender. Eyes: Conjunctivae clear without exudates or hemorrhage Neck: Supple, no carotid bruits. Pulmonary: Clear to auscultation bilaterally   NEUROLOGICAL EXAM:  MENTAL STATUS: Speech:    Speech is normal; fluent and spontaneous with normal comprehension.  Cognition:     Orientation to time, place and person     Normal recent and remote memory     Normal Attention span and concentration     Normal Language, naming, repeating,spontaneous speech     Fund of knowledge   CRANIAL NERVES: CN II: Visual fields are full to confrontation.  Pupils are round equal and briskly reactive to light. CN III, IV, VI: extraocular movement are normal. No ptosis. CN V: Facial sensation is intact to pinprick in all 3 divisions bilaterally. Corneal responses are intact.  CN VII: Face is symmetric with normal eye closure and smile. CN VIII: Hearing is normal to rubbing fingers CN IX, X: Palate elevates symmetrically. Phonation is normal. CN XI: Head turning and shoulder shrug are intact CN XII: Tongue is midline  with normal movements and no atrophy.  MOTOR: There is no pronator drift of out-stretched arms. Muscle bulk and tone are normal. Muscle strength is normal.  REFLEXES: Reflexes are 2+ and symmetric at the biceps, triceps, knees, and ankles. Plantar responses are flexor.  SENSORY: Intact to light touch, pinprick, positional sensation and vibratory sensation are intact in fingers and toes.  COORDINATION: Rapid alternating movements and fine finger movements are intact. There is no dysmetria on finger-to-nose and heel-knee-shin.    GAIT/STANCE: Posture is normal. Gait is steady with normal steps, base, arm swing, and turning. Heel and toe walking are normal. Tandem gait is normal.  Romberg is absent.  DIAGNOSTIC DATA (LABS, IMAGING, TESTING) - I reviewed patient records, labs, notes, testing and imaging myself where available.   ASSESSMENT AND PLAN  Jilliyn Sides is a 58 y.o. female   Bilateral lower extremity pain  Unsure etiology,  The patient apparently is very bothered by her symptoms, is tearful at today's clinical visit,  MRI of lumbar spine showed L4-5, pseudo-disc bulging with mild spinal stenosis, moderate bilateral foraminal stenosis grade 2 spondylolisthesis of L4 anterior to L5,  EMG nerve conduction study showed no evidence of bilateral lower extremity neuropathy or right lumbosacral radiculopathy  She has tried and failed could not tolerate Cymbalta, gabapentin  Mobic as needed,  Laboratory evaluation for inflammatory markers,  MRI of cervical spine,  May also consider pain management referral  Levert Feinstein, M.D. Ph.D.  [facility] [address], [address] [place], [place] [address] Ph: [phone] Fax: [phone]  CC: Laqueta Due., MD

## 2018-12-31 LAB — C-REACTIVE PROTEIN: CRP: <1 mg/L (ref 0–10)

## 2018-12-31 LAB — CBC WITH DIFFERENTIAL/PLATELET
BASOS: 1 %
Basophils Absolute: 0 10*3/uL (ref 0.0–0.2)
EOS (ABSOLUTE): 0.2 10*3/uL (ref 0.0–0.4)
Eos: 5 %
HEMATOCRIT: 37.5 % (ref 34.0–46.6)
Hemoglobin: 12.8 g/dL (ref 11.1–15.9)
IMMATURE GRANS (ABS): 0 10*3/uL (ref 0.0–0.1)
Immature Granulocytes: 0 %
LYMPHS: 29 %
Lymphocytes Absolute: 0.9 10*3/uL (ref 0.7–3.1)
MCH: 30.2 pg (ref 26.6–33.0)
MCHC: 34.1 g/dL (ref 31.5–35.7)
MCV: 88 fL (ref 79–97)
Monocytes Absolute: 0.2 10*3/uL (ref 0.1–0.9)
Monocytes: 7 %
Neutrophils Absolute: 1.9 10*3/uL (ref 1.4–7.0)
Neutrophils: 58 %
Platelets: 208 10*3/uL (ref 150–450)
RBC: 4.24 x10E6/uL (ref 3.77–5.28)
RDW: 13.3 % (ref 11.7–15.4)
WBC: 3.2 10*3/uL — ABNORMAL LOW (ref 3.4–10.8)

## 2018-12-31 LAB — COMPREHENSIVE METABOLIC PANEL
A/G RATIO: 1.5 (ref 1.2–2.2)
ALT: 14 IU/L (ref 0–32)
AST: 20 IU/L (ref 0–40)
Albumin: 4 g/dL (ref 3.8–4.9)
Alkaline Phosphatase: 95 IU/L (ref 39–117)
BUN/Creatinine Ratio: 21 (ref 9–23)
BUN: 20 mg/dL (ref 6–24)
Bilirubin Total: 0.3 mg/dL (ref 0.0–1.2)
CALCIUM: 9.9 mg/dL (ref 8.7–10.2)
CO2: 21 mmol/L (ref 20–29)
Chloride: 108 mmol/L — ABNORMAL HIGH (ref 96–106)
Creatinine, Ser: 0.95 mg/dL (ref 0.57–1.00)
GFR calc Af Amer: 76 mL/min/{1.73_m2} (ref >59)
GFR, EST NON AFRICAN AMERICAN: 66 mL/min/{1.73_m2} (ref >59)
Globulin, Total: 2.6 g/dL (ref 1.5–4.5)
Glucose: 86 mg/dL (ref 65–99)
POTASSIUM: 4.6 mmol/L (ref 3.5–5.2)
SODIUM: 142 mmol/L (ref 134–144)
TOTAL PROTEIN: 6.6 g/dL (ref 6.0–8.5)

## 2018-12-31 LAB — VITAMIN D 25 HYDROXY (VIT D DEFICIENCY, FRACTURES): VIT D 25 HYDROXY: 19 ng/mL — AB (ref 30.0–100.0)

## 2018-12-31 LAB — VITAMIN B12: Vitamin B-12: 1490 pg/mL — ABNORMAL HIGH (ref 232–1245)

## 2018-12-31 LAB — RPR: RPR: NONREACTIVE

## 2018-12-31 LAB — CK: Total CK: 162 U/L (ref 24–173)

## 2018-12-31 LAB — TSH: TSH: 1.99 u[IU]/mL (ref 0.450–4.500)

## 2018-12-31 LAB — SEDIMENTATION RATE: Sed Rate: 2 mm/hr (ref 0–40)

## 2018-12-31 LAB — FOLATE: Folate: 3.6 ng/mL (ref >3.0)

## 2019-01-03 ENCOUNTER — Telehealth: Payer: Self-pay | Admitting: Neurology

## 2019-01-03 NOTE — Telephone Encounter (Signed)
[employer] Auth: [employer] spoke to [facility] Ref # [id number] u order sent to GI. They will reach out to the pt to schedule.

## 2019-01-04 ENCOUNTER — Telehealth: Payer: Self-pay | Admitting: Neurology

## 2019-01-04 NOTE — Telephone Encounter (Signed)
I have called patient for low vitamin D level 19, she should take vitamin D3 supplements 1000 units daily, rest of the laboratory evaluation showed no significant abnormality.

## 2019-01-25 ENCOUNTER — Telehealth: Payer: Self-pay

## 2019-01-25 NOTE — Telephone Encounter (Signed)
I called and left a detailed message for patient making her aware that Due to current COVID 19 pandemic, our office is severely reducing in office visits for at least the next 2 weeks, in order to minimize the risk to our patients and healthcare providers.   I briefly explained that we would like to convert this appt to a virtual visit and asked that she call us back for further details. If patient would like to go ahead with virtual visit, please get her email address.

## 2019-01-26 NOTE — Telephone Encounter (Signed)
I tried calling patient again to discuss her 01/31/19 appt. Patient did not answer so I left another message asking that patient call us back.

## 2019-01-30 ENCOUNTER — Telehealth: Payer: Self-pay | Admitting: Neurology

## 2019-01-30 NOTE — Telephone Encounter (Signed)
Called Patient x3 and she never called back to schedule. I relayed to patient she could call us back and I would be more than happy to get her scheduled for Virtual visit.

## 2019-01-31 ENCOUNTER — Ambulatory Visit: Payer: Self-pay | Admitting: Neurology

## 2019-02-28 ENCOUNTER — Other Ambulatory Visit: Payer: BLUE CROSS/BLUE SHIELD

## 2019-09-28 ENCOUNTER — Telehealth: Payer: Self-pay

## 2019-09-28 ENCOUNTER — Other Ambulatory Visit: Payer: Self-pay

## 2019-09-28 ENCOUNTER — Encounter: Payer: Self-pay | Admitting: Neurology

## 2019-09-28 DIAGNOSIS — Z1211 Encounter for screening for malignant neoplasm of colon: Secondary | ICD-10-CM

## 2019-09-28 NOTE — Telephone Encounter (Signed)
Patients call has been returned.  Colonoscopy rescheduled from January 5th to January 7th at [facility].  [facility] in Endoscopy has been informed of date change.  Thanks [employer]

## 2019-10-17 ENCOUNTER — Other Ambulatory Visit: Payer: Self-pay

## 2019-10-17 ENCOUNTER — Other Ambulatory Visit
Admission: RE | Admit: 2019-10-17 | Discharge: 2019-10-17 | Disposition: A | Discharge status: Home / Self Care | Payer: BC Managed Care – PPO | Source: Ambulatory Visit | Attending: Gastroenterology | Admitting: Gastroenterology

## 2019-10-17 DIAGNOSIS — Z01812 Encounter for preprocedural laboratory examination: Secondary | ICD-10-CM | POA: Diagnosis present

## 2019-10-17 DIAGNOSIS — Z20822 Contact with and (suspected) exposure to covid-19: Secondary | ICD-10-CM | POA: Insufficient documentation

## 2019-10-18 LAB — SARS CORONAVIRUS 2 (TAT 6-24 HRS): SARS Coronavirus 2: NEGATIVE

## 2019-10-19 ENCOUNTER — Encounter
Admission: RE | Disposition: A | Discharge status: Home / Self Care | Payer: Self-pay | Source: Ambulatory Visit | Attending: Gastroenterology

## 2019-10-19 ENCOUNTER — Other Ambulatory Visit: Payer: Self-pay

## 2019-10-19 ENCOUNTER — Encounter: Payer: Self-pay | Admitting: Gastroenterology

## 2019-10-19 ENCOUNTER — Ambulatory Visit: Payer: BC Managed Care – PPO | Admitting: Certified Registered Nurse Anesthetist

## 2019-10-19 ENCOUNTER — Ambulatory Visit
Admission: RE | Admit: 2019-10-19 | Discharge: 2019-10-19 | Disposition: A | Discharge status: Home / Self Care | Payer: BC Managed Care – PPO | Source: Ambulatory Visit | Attending: Gastroenterology | Admitting: Gastroenterology

## 2019-10-19 DIAGNOSIS — Z791 Long term (current) use of non-steroidal anti-inflammatories (NSAID): Secondary | ICD-10-CM | POA: Insufficient documentation

## 2019-10-19 DIAGNOSIS — E039 Hypothyroidism, unspecified: Secondary | ICD-10-CM | POA: Diagnosis not present

## 2019-10-19 DIAGNOSIS — M25551 Pain in right hip: Secondary | ICD-10-CM | POA: Diagnosis not present

## 2019-10-19 DIAGNOSIS — Z8249 Family history of ischemic heart disease and other diseases of the circulatory system: Secondary | ICD-10-CM | POA: Diagnosis not present

## 2019-10-19 DIAGNOSIS — Z87891 Personal history of nicotine dependence: Secondary | ICD-10-CM | POA: Insufficient documentation

## 2019-10-19 DIAGNOSIS — Z800 Family history of malignant neoplasm of digestive organs: Secondary | ICD-10-CM | POA: Diagnosis not present

## 2019-10-19 DIAGNOSIS — Z833 Family history of diabetes mellitus: Secondary | ICD-10-CM | POA: Diagnosis not present

## 2019-10-19 DIAGNOSIS — Z1211 Encounter for screening for malignant neoplasm of colon: Secondary | ICD-10-CM | POA: Diagnosis present

## 2019-10-19 DIAGNOSIS — Z79899 Other long term (current) drug therapy: Secondary | ICD-10-CM | POA: Insufficient documentation

## 2019-10-19 HISTORY — PX: COLONOSCOPY WITH PROPOFOL: SHX5780

## 2019-10-19 SURGERY — COLONOSCOPY WITH PROPOFOL
Anesthesia: General

## 2019-10-19 MED ORDER — PROPOFOL 500 MG/50ML IV EMUL
INTRAVENOUS | Status: DC | PRN
  Administered 2019-10-19: 160 ug/kg/min via INTRAVENOUS

## 2019-10-19 MED ORDER — MIDAZOLAM HCL 2 MG/2ML IJ SOLN
INTRAMUSCULAR | Status: DC | PRN
  Administered 2019-10-19: 2 mg via INTRAVENOUS

## 2019-10-19 MED ORDER — MIDAZOLAM HCL 2 MG/2ML IJ SOLN
INTRAMUSCULAR | Status: AC
  Filled 2019-10-19: qty 2

## 2019-10-19 MED ORDER — PROPOFOL 10 MG/ML IV BOLUS
INTRAVENOUS | Status: DC | PRN
  Administered 2019-10-19: 30 mg via INTRAVENOUS
  Administered 2019-10-19: 70 mg via INTRAVENOUS

## 2019-10-19 MED ORDER — ONDANSETRON HCL 4 MG/2ML IJ SOLN
INTRAMUSCULAR | Status: DC | PRN
  Administered 2019-10-19: 4 mg via INTRAVENOUS

## 2019-10-19 MED ORDER — ONDANSETRON HCL 4 MG/2ML IJ SOLN
INTRAMUSCULAR | Status: AC
  Filled 2019-10-19: qty 2

## 2019-10-19 MED ORDER — SODIUM CHLORIDE 0.9 % IV SOLN: INTRAVENOUS | Status: DC

## 2019-10-19 MED ORDER — PHENYLEPHRINE HCL (PRESSORS) 10 MG/ML IV SOLN
INTRAVENOUS | Status: DC | PRN
  Administered 2019-10-19: 100 ug via INTRAVENOUS

## 2019-10-19 NOTE — H&P (Signed)
Dana Bouillon, MD [address], [place], [place], [address] [address], [address], [place], [place], [address] Phone: [phone]  Fax: [phone]  Primary Care Physician:  Laqueta Due., MD   Pre-Procedure History & Physical: HPI:  Dana Rosales is a 59 y.o. female is here for a colonoscopy.   Past Medical History:  Diagnosis Date  . H. pylori infection   . Hypothyroid   . Leg pain    bilateral  . Right hip pain     History reviewed. No pertinent surgical history.  Prior to Admission medications   Medication Sig Start Date End Date Taking? Authorizing Provider  levothyroxine (SYNTHROID, LEVOTHROID) 75 MCG tablet Take 75 mcg by mouth daily before breakfast.   Yes [provider]  ALPRAZolam (XANAX) 0.5 MG tablet Take 1 tablet (0.5 mg total) by mouth at bedtime as needed for anxiety. Take 1-2 tablets 30 minutes prior to MRI, may repeat once as needed. Must have driver. Patient not taking: Reported on 10/19/2019 11/10/18   Levert Feinstein, MD  DULoxetine (CYMBALTA) 60 MG capsule Take 1 capsule (60 mg total) by mouth daily. 11/18/18   Levert Feinstein, MD  gabapentin (NEURONTIN) 300 MG capsule Take 1 capsule (300 mg total) by mouth 3 (three) times daily. Patient not taking: Reported on 10/19/2019 11/10/18   Levert Feinstein, MD  meloxicam (MOBIC) 7.5 MG tablet Take 1 tablet (7.5 mg total) by mouth daily. Patient not taking: Reported on 10/19/2019 12/30/18   Levert Feinstein, MD    Allergies as of 09/28/2019 - Review Complete 11/10/2018  Allergen Reaction Noted  . Codeine Nausea And Vomiting 01/20/2017    Family History  Problem Relation Age of Onset  . Hypertension Mother   . Colon cancer Father   . Diabetes Father     Social History   Socioeconomic History  . Marital status: Married    Spouse name: Not on file  . Number of children: 0  . Years of education: some college  . Highest education level: Not on file  Occupational History  . Occupation: compliance analyst    Tobacco Use  . Smoking status: Former Games developer  . Smokeless tobacco: Never Used  Substance and Sexual Activity  . Alcohol use: Yes    Comment: occasional  . Drug use: No  . Sexual activity: Not on file  Other Topics Concern  . Not on file  Social History Narrative   Lives at home with husband.   Right-handed.    No caffeine use.   Social Determinants of Health   Financial Resource Strain:   . Difficulty of Paying Living Expenses: Not on file  Food Insecurity:   . Worried About Programme researcher, broadcasting/film/video in the Last Year: Not on file  . Ran Out of Food in the Last Year: Not on file  Transportation Needs:   . Lack of Transportation (Medical): Not on file  . Lack of Transportation (Non-Medical): Not on file  Physical Activity:   . Days of Exercise per Week: Not on file  . Minutes of Exercise per Session: Not on file  Stress:   . Feeling of Stress : Not on file  Social Connections:   . Frequency of Communication with Friends and Family: Not on file  . Frequency of Social Gatherings with Friends and Family: Not on file  . Attends Religious Services: Not on file  . Active Member of Clubs or Organizations: Not on file  . Attends Banker Meetings: Not on  file  . Marital Status: Not on file  Intimate Partner Violence:   . Fear of Current or Ex-Partner: Not on file  . Emotionally Abused: Not on file  . Physically Abused: Not on file  . Sexually Abused: Not on file    Review of Systems: See HPI, otherwise negative ROS  Physical Exam: BP 120/83   Pulse 66   Temp 98.4 F (36.9 C) (Temporal)   Resp 16   Ht 5\' 6"  (1.676 m)   Wt 70.3 kg   SpO2 100%   BMI 25.02 kg/m  General:   Alert,  pleasant and cooperative in NAD Head:  Normocephalic and atraumatic. Neck:  Supple; no masses or thyromegaly. Lungs:  Clear throughout to auscultation, normal respiratory effort.    Heart:  +S1, +S2, Regular rate and rhythm, No edema. Abdomen:  Soft, nontender and nondistended.  Normal bowel sounds, without guarding, and without rebound.   Neurologic:  Alert and  oriented x4;  grossly normal neurologically.  Impression/Plan: Dana Rosales is here for a colonoscopy to be performed for average risk screening.  Risks, benefits, limitations, and alternatives regarding  colonoscopy have been reviewed with the patient.  Questions have been answered.  All parties agreeable.   Dana Manifold, MD  10/19/2019, 11:06 AM

## 2019-10-19 NOTE — Anesthesia Preprocedure Evaluation (Signed)
Anesthesia Evaluation  Patient identified by MRN, date of birth, ID band Patient awake    Reviewed: Allergy & Precautions, NPO status , Patient's Chart, lab work & pertinent test results  History of Anesthesia Complications Negative for: history of anesthetic complications  Airway Mallampati: II  TM Distance: >3 FB Neck ROM: Full    Dental no notable dental hx. (+) Teeth Intact   Pulmonary neg pulmonary ROS, neg sleep apnea, neg COPD, Patient abstained from smoking.Not current smoker, former smoker,    Pulmonary exam normal breath sounds clear to auscultation       Cardiovascular Exercise Tolerance: Good METS(-) hypertension(-) CAD and (-) Past MI negative cardio ROS  (-) dysrhythmias  Rhythm:Regular Rate:Normal - Systolic murmurs    Neuro/Psych negative neurological ROS  negative psych ROS   GI/Hepatic neg GERD  ,(+)     (-) substance abuse  ,   Endo/Other  neg diabetesHypothyroidism   Renal/GU negative Renal ROS     Musculoskeletal   Abdominal   Peds  Hematology   Anesthesia Other Findings Past Medical History: No date: H. pylori infection No date: Hypothyroid No date: Leg pain     Comment:  bilateral No date: Right hip pain  Reproductive/Obstetrics                             Anesthesia Physical Anesthesia Plan  ASA: II  Anesthesia Plan: General   Post-op Pain Management:    Induction: Intravenous  PONV Risk Score and Plan: 3 and Ondansetron, Propofol infusion and TIVA  Airway Management Planned: Nasal Cannula  Additional Equipment: None  Intra-op Plan:   Post-operative Plan:   Informed Consent: I have reviewed the patients History and Physical, chart, labs and discussed the procedure including the risks, benefits and alternatives for the proposed anesthesia with the patient or authorized representative who has indicated his/her understanding and acceptance.      Dental advisory given  Plan Discussed with: CRNA and Surgeon  Anesthesia Plan Comments: (Discussed risks of anesthesia with patient, including possibility of difficulty with spontaneous ventilation under anesthesia necessitating airway intervention, PONV, and rare risks such as cardiac or respiratory events. Patient understands.)        Anesthesia Quick Evaluation

## 2019-10-19 NOTE — Op Note (Signed)
[facility] Gastroenterology Patient Name: Dana Rosales Procedure Date: 10/19/2019 11:07 AM MRN: [id number] Account #: [id number] Date of Birth: 1961-10-03 Admit Type: Outpatient Age: 59 Room: [facility] ENDO ROOM 4 Gender: Female Note Status: Finalized Procedure:             Colonoscopy Indications:           Screening in patient at increased risk: Family history                         of 1st-degree relative with colorectal cancer Providers:             Annabell Oconnor B. Bonna Gains MD, MD Referring MD:          Cleone Slim. Rozetta Nunnery (Referring MD) Medicines:             Monitored Anesthesia Care Complications:         No immediate complications. Procedure:             Pre-Anesthesia Assessment:                        - Prior to the procedure, a History and Physical was                         performed, and patient medications, allergies and                         sensitivities were reviewed. The patient's tolerance                         of previous anesthesia was reviewed.                        - The risks and benefits of the procedure and the                         sedation options and risks were discussed with the                         patient. All questions were answered and informed                         consent was obtained.                        - Patient identification and proposed procedure were                         verified prior to the procedure by the physician, the                         nurse, the anesthetist and the technician. The                         procedure was verified in the pre-procedure area in                         the procedure room in the endoscopy suite.                        -  ASA Grade Assessment: II - A patient with mild                         systemic disease.                        - After reviewing the risks and benefits, the patient                         was deemed in satisfactory condition to undergo the     procedure.                        After obtaining informed consent, the colonoscope was                         passed under direct vision. Throughout the procedure,                         the patient's blood pressure, pulse, and oxygen                         saturations were monitored continuously. The                         Colonoscope was introduced through the anus and                         advanced to the the cecum, identified by appendiceal                         orifice and ileocecal valve. The colonoscopy was                         performed with ease. The patient tolerated the                         procedure well. The quality of the bowel preparation                         was good. Findings:      The perianal and digital rectal examinations were normal.      The rectum, sigmoid colon, descending colon, transverse colon, ascending       colon and cecum appeared normal.      Anal papilla(e) were hypertrophied.      The retroflexed view of the distal rectum and anal verge was normal and       showed no anal or rectal abnormalities. Impression:            - The rectum, sigmoid colon, descending colon,                         transverse colon, ascending colon and cecum are normal.                        - Anal papilla(e) were hypertrophied.                        - The distal rectum and anal verge are normal on  retroflexion view.                        - No specimens collected. Recommendation:        - Discharge patient to home.                        - Resume previous diet.                        - Continue present medications.                        - Repeat colonoscopy in 5 years for screening purposes.                        - Return to primary care physician as previously                         scheduled.                        - The findings and recommendations were discussed with                         the patient.                         - The findings and recommendations were discussed with                         the patient's family. Procedure Code(s):     --- Professional ---                        E7209, Colorectal cancer screening; colonoscopy on                         individual at high risk Diagnosis Code(s):     --- Professional ---                        Z80.0, Family history of malignant neoplasm of                         digestive organs CPT copyright 2019 American Medical Association. All rights reserved. The codes documented in this report are preliminary and upon coder review may  be revised to meet current compliance requirements.  Melodie Bouillon, MD Michel Bickers B. Maximino Greenland MD, MD 10/19/2019 11:47:49 AM This report has been signed electronically. Number of Addenda: 0 Note Initiated On: 10/19/2019 11:07 AM Scope Withdrawal Time: 0 hours 15 minutes 7 seconds  Total Procedure Duration: 0 hours 21 minutes 51 seconds       [facility]

## 2019-10-19 NOTE — Anesthesia Postprocedure Evaluation (Signed)
Anesthesia Post Note  Patient: Yona Kosek  Procedure(s) Performed: COLONOSCOPY WITH PROPOFOL (N/A )  Patient location during evaluation: Endoscopy Anesthesia Type: General Level of consciousness: awake and alert Pain management: pain level controlled Vital Signs Assessment: post-procedure vital signs reviewed and stable Respiratory status: spontaneous breathing, nonlabored ventilation, respiratory function stable and patient connected to nasal cannula oxygen Cardiovascular status: blood pressure returned to baseline and stable Postop Assessment: no apparent nausea or vomiting Anesthetic complications: no     Last Vitals:  Vitals:   10/19/19 1019 10/19/19 1141  BP: 120/83 (!) 85/45  Pulse: 66 67  Resp: 16 16  Temp: 36.9 C (!) 36.1 C  SpO2: 100% 100%    Last Pain:  Vitals:   10/19/19 1141  TempSrc: Temporal  PainSc: 0-No pain                 Corinda Gubler

## 2019-10-19 NOTE — Transfer of Care (Signed)
Immediate Anesthesia Transfer of Care Note  Patient: Dana Rosales  Procedure(s) Performed: COLONOSCOPY WITH PROPOFOL (N/A )  Patient Location: PACU  Anesthesia Type:General  Level of Consciousness: drowsy  Airway & Oxygen Therapy: Patient Spontanous Breathing and Patient connected to nasal cannula oxygen  Post-op Assessment: Report given to RN and Post -op Vital signs reviewed and stable  Post vital signs: Reviewed and stable  Last Vitals:  Vitals Value Taken Time  BP    Temp    Pulse    Resp    SpO2      Last Pain:  Vitals:   10/19/19 1019  TempSrc: Temporal  PainSc: 0-No pain         Complications: No apparent anesthesia complications

## 2019-11-02 ENCOUNTER — Ambulatory Visit: Payer: BC Managed Care – PPO | Admitting: Neurology

## 2019-11-03 ENCOUNTER — Ambulatory Visit: Payer: BC Managed Care – PPO | Admitting: Neurology

## 2019-11-03 ENCOUNTER — Other Ambulatory Visit: Payer: Self-pay

## 2019-11-03 ENCOUNTER — Encounter: Payer: Self-pay | Admitting: Neurology

## 2019-11-03 VITALS — BP 112/86 | HR 89 | Ht 66.0 in | Wt 155.0 lb

## 2019-11-03 DIAGNOSIS — M79604 Pain in right leg: Secondary | ICD-10-CM | POA: Diagnosis not present

## 2019-11-03 MED ORDER — CYCLOBENZAPRINE HCL 5 MG PO TABS: 5.0000 mg | ORAL_TABLET | Freq: Every evening | ORAL | 0 refills | Status: AC | PRN

## 2019-11-03 NOTE — Progress Notes (Signed)
[facility] Neurology Division Clinic Note - Initial Visit   Date: 11/03/19  Dana Rosales MRN: [id number] DOB: Dec 27, 1960   Dear Dr. Kristen Loader:   Thank you for your kind referral of Dana Rosales for consultation of right leg pain. Although her history is well known to you, please allow [place] to reiterate it for the purpose of our medical record. The patient was accompanied to the clinic by self.   History of Present Illness: Dana Rosales is a 59 y.o. right-handed female with hypothyroidism presenting for evaluation of right leg pain.   Starting around late 2019, she began having right hip/buttocks pain which radiates into her right leg.  Pain is described as stabbing in the hip and feels more pressure-like in the thigh and lower leg. Symptoms occur daily and is triggered by sitting and certain exercises. She gets relief with yoga stretches.  She has not done physical therapy.  Sometimes, she has tight sensation in the left leg, but there is no associated pain.  She was offered gabapentin, cymbalta, and mobic which did not provide any relief.  She has been evaluated for these symptoms by Dr. Terrace Arabia at [facility] in 2020 and had NCS/EMG of the legs which was normal and MRI lumbar spine which did not show structural impingement.   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  With that said, she stays to exercise 5-days per week.  She lives with her husband.    Out-side paper records, electronic medical record, and images have been reviewed where available and summarized as:  MRI lumbar spine wo contrast 11/17/2018: Abnormal MRI lumbar spine (without) demonstrating: - At L4-5: pseudo-disc bulging with mild spinal stenosis and moderate biforaminal stenosis; grade 2 spondylolisthesis of L4 anterior to L5 measuring 75mm. - At L2-3: disc bulging with mild biforaminal stenosis. - At L3-4: disc bulging with mild biforaminal stenosis.  NCS/EMG of the legs 12/30/2018:   Normal  No results found for: HGBA1C Lab Results  Component Value Date   VITAMINB12 1,490 (H) 12/30/2018   Lab Results  Component Value Date   TSH 1.990 12/30/2018   Lab Results  Component Value Date   ESRSEDRATE 2 12/30/2018    Past Medical History:  Diagnosis Date  . H. pylori infection   . Hypothyroid   . Leg pain    bilateral  . Right hip pain     Past Surgical History:  Procedure Laterality Date  . COLONOSCOPY WITH PROPOFOL N/A 10/19/2019   Procedure: COLONOSCOPY WITH PROPOFOL;  Surgeon: Pasty Spillers, MD;  Location: [facility] ENDOSCOPY;  Service: Endoscopy;  Laterality: N/A;     Medications:  Outpatient Encounter Medications as of 11/03/2019  Medication Sig Note  . levothyroxine (SYNTHROID, LEVOTHROID) 75 MCG tablet Take 75 mcg by mouth daily before breakfast.   . ALPRAZolam (XANAX) 0.5 MG tablet Take 1 tablet (0.5 mg total) by mouth at bedtime as needed for anxiety. Take 1-2 tablets 30 minutes prior to MRI, may repeat once as needed. Must have driver. (Patient not taking: Reported on 10/19/2019)   . DULoxetine (CYMBALTA) 60 MG capsule Take 1 capsule (60 mg total) by mouth daily. (Patient not taking: Reported on 11/03/2019)   . gabapentin (NEURONTIN) 300 MG capsule Take 1 capsule (300 mg total) by mouth 3 (three) times daily. (Patient not taking: Reported on 10/19/2019) 11/18/2018: 11/18/2018 - Patient only taking one capsule at bedtime (see phone note).  . meloxicam (MOBIC) 7.5 MG tablet Take 1 tablet (7.5  mg total) by mouth daily. (Patient not taking: Reported on 10/19/2019)    No facility-administered encounter medications on file as of 11/03/2019.    Allergies:  Allergies  Allergen Reactions  . Codeine Nausea And Vomiting    Other reaction(s): GI Upset (intolerance)    Family History: Family History  Problem Relation Age of Onset  . Hypertension Mother   . Colon cancer Father   . Diabetes Father     Social History: Social History   Tobacco Use  . Smoking  status: Former Games developer  . Smokeless tobacco: Never Used  Substance Use Topics  . Alcohol use: Yes    Comment: occasional  . Drug use: No   Social History   Social History Narrative   Lives at home with husband.   Right-handed.    No caffeine use.   One story home    Vital Signs:  BP 112/86   Pulse 89   Ht 5\' 6"  (1.676 m)   Wt 155 lb (70.3 kg)   SpO2 98%   BMI 25.02 kg/m    Neurological Exam: MENTAL STATUS including orientation to time, place, person, recent and remote memory, attention span and concentration, language, and fund of knowledge is normal.  Speech is not dysarthric.  CRANIAL NERVES: II:  No visual field defects.   III-IV-VI: Pupils equal round and reactive to light.  Normal conjugate, extra-ocular eye movements in all directions of gaze.  No nystagmus.  No ptosis.   V:  Normal facial sensation.    VII:  Normal facial symmetry and movements.   VIII:  Normal hearing and vestibular function.   IX-X:  Normal palatal movement.   XI:  Normal shoulder shrug and head rotation.   XII:  Normal tongue strength and range of motion, no deviation or fasciculation.  MOTOR:  No atrophy, fasciculations or abnormal movements.  No pronator drift.   Upper Extremity:  Right  Left  Deltoid  5/5   5/5   Biceps  5/5   5/5   Triceps  5/5   5/5   Infraspinatus 5/5  5/5  Medial pectoralis 5/5  5/5  Wrist extensors  5/5   5/5   Wrist flexors  5/5   5/5   Finger extensors  5/5   5/5   Finger flexors  5/5   5/5   Dorsal interossei  5/5   5/5   Abductor pollicis  5/5   5/5   Tone (Ashworth scale)  0  0   Lower Extremity:  Right  Left  Hip flexors  5/5   5/5   Hip extensors  5/5   5/5   Adductor 5/5  5/5  Abductor 5/5  5/5  Knee flexors  5/5   5/5   Knee extensors  5/5   5/5   Dorsiflexors  5/5   5/5   Plantarflexors  5/5   5/5   Toe extensors  5/5   5/5   Toe flexors  5/5   5/5   Tone (Ashworth scale)  0  0   MSRs:  Right        Left                  brachioradialis  2+  2+  biceps 2+  2+  triceps 2+  2+  patellar 2+  2+  ankle jerk 2+  2+  Hoffman no  no  plantar response down  down   SENSORY:  Normal and symmetric perception of  light touch, pinprick, vibration, and proprioception.  Romberg's sign absent.   COORDINATION/GAIT: Normal finger-to- nose-finger and heel-to-shin.  Intact rapid alternating movements bilaterally.  Able to rise from a chair without using arms.  Gait narrow based and stable. Tandem and stressed gait intact.    IMPRESSION: Right leg pain possibly due to piriformis syndrome.  Patient reassured that with her normal exam, NCS/EMG, and MRI lumbar spine worrisome pathologies have been excluded.  Imaging and EDX was personally viewed and agree with findings.   - Recommend PT for right leg stretching  - Start flexeril 5mg  at bedtime as needed for severe pain   Thank you for allowing me to participate in patient's care.  If I can answer any additional questions, I would be pleased to do so.    Sincerely,    Aliah Eriksson K. Posey Pronto, DO

## 2019-11-03 NOTE — Patient Instructions (Signed)
Start physical therapy for right leg stretching  You can take flexeril 5mg  at bedtime for severe pain.  It will make you sleepy, so do not drive  Return to clinic as needed

## 2019-11-13 ENCOUNTER — Ambulatory Visit: Payer: BC Managed Care – PPO | Admitting: Physical Therapy

## 2019-11-15 ENCOUNTER — Encounter: Payer: Self-pay | Admitting: Physical Therapy

## 2019-11-15 ENCOUNTER — Other Ambulatory Visit: Payer: Self-pay

## 2019-11-15 ENCOUNTER — Encounter: Payer: BC Managed Care – PPO | Admitting: Physical Therapy

## 2019-11-15 ENCOUNTER — Ambulatory Visit: Payer: BC Managed Care – PPO | Attending: Neurology | Admitting: Physical Therapy

## 2019-11-15 DIAGNOSIS — M79604 Pain in right leg: Secondary | ICD-10-CM | POA: Diagnosis present

## 2019-11-15 DIAGNOSIS — M5431 Sciatica, right side: Secondary | ICD-10-CM | POA: Diagnosis present

## 2019-11-15 DIAGNOSIS — M5432 Sciatica, left side: Secondary | ICD-10-CM | POA: Diagnosis present

## 2019-11-15 NOTE — Therapy (Signed)
[place] [address] [place], [address] Phone: [phone]   Fax:  [phone]  Physical Therapy Evaluation  Patient Details  Name: Dana Rosales MRN: [id number] Date of Birth: January 16, 1961 Referring Provider (PT): Alda Berthold, DO   Encounter Date: 11/15/2019  PT End of Session - 11/15/19 1019    Visit Number  1    Number of Visits  12    Date for PT Re-Evaluation  12/27/19    Authorization Type  [employer] reporting period from 11/15/2019    Authorization - Visit Number  1    Authorization - Number of Visits  10    PT Start Time  0900    PT Stop Time  1000    PT Time Calculation (min)  60 min    Activity Tolerance  Patient tolerated treatment well;Patient limited by pain    Behavior During Therapy  [facility] for tasks assessed/performed       Past Medical History:  Diagnosis Date  . H. pylori infection   . Hypothyroid   . Leg pain    bilateral  . Right hip pain     Past Surgical History:  Procedure Laterality Date  . COLONOSCOPY WITH PROPOFOL N/A 10/19/2019   Procedure: COLONOSCOPY WITH PROPOFOL;  Surgeon: Virgel Manifold, MD;  Location: [facility] ENDOSCOPY;  Service: Endoscopy;  Laterality: N/A;    There were no vitals filed for this visit.   Subjective Assessment - 11/15/19 0923    Subjective  Patient states condition started at least a year ago and has been pretty much staying the same. She thinks she may have gotten used to it. She had no identifiable injury or trauma. Patient reports she has pain all day every day. It is moderate for the most part but it can become severe. When she is sitting in certain chairs more than others, her right buttocks cheek will begin to hurt. Same when driving. She has to shift to left buttocks to relieve pain. She also gets more severe pain when she stands a long time when cooking. She practices yoga and does notice that she can get temporary relief when she does a  forward fold and hangs out there for a while. It is temporary until she sits in the wrong position. It feels like a stabbing pain in her hip area, and down the side of her leg it feels like pressure, almost like there is tube running down the side of my leg with more liquid or air than it can hold like it is about to burst. Denies numbness or tingling. Leg pain stops mid lower leg. Hip pain is pretty constant always sharp and stabbing at R hip and the leg pain comes and goes. She feels the pressure on the left leg as well but it is not as often, and it comes on when the R side is worse. She can control it better now that she knows that forward folds gives her some relief (5 min, the longer the better). She saw two neurologists. The first one ran all the tests that came back negative for everything or that everything was normal. She had NCS/EMG that was normal. Patient thought it must be a nerve, but the physicians said there was no evidence on the tests of nerve impingement. She tried a medication but she is not big on medications and it didn't seem to do anything. She thinks she took gabapentin at several different dosages. She  was also prescribed a muscle relaxant, but has not taken it. Primary care did an injection once and it was great for one day, then the pain returned. She thinks the injection was in the hip. Denies popping/clicking in hip.    Pertinent History  Patient is a 59 y.o. female who presents to outpatient physical therapy with a referral for medical diagnosis right leg pain. This patient's chief complaints consist of right hip pain with radiating leg pain R > L, leading to the following functional deficits:  it doesn't stop her from doing anything, but it hurts when she is doing it. Works out 5 days a week and she has to work through the pain. If she turns the wrong way while sleeping so she has to be careful how she turns when sleeping. Has limitations in standing, sitting, traveling. Relevant  past medical history and comorbidities include hypothyroidism, right lumbar radiculopathy, right hip pain. Patient denies history of major cardiac event, stroke, seizure, diabetes, lung problems, cancer, bowel/bladder changes.    Limitations  Sitting;Standing;Other (comment)   it doesn't stop her, but it hurts. Works out 5 days a week and she has to work through the pain. If she turns the wrong way while sleeping so she has to be careful how she turns when sleeping. Has limitations in standing, sitting, traveling.   How long can you sit comfortably?  maybe 10-15 min    How long can you stand comfortably?  30-45 min    Diagnostic tests  Lumbar MRI report 11/16/2018: "IMPRESSION: Abnormal MRI lumbar spine (without) demonstrating: - At L4-5: pseudo-disc bulging with mild spinal stenosis and moderate biforaminal stenosis; grade 2 spondylolisthesis of L4 anterior to L5 measuring 31mm. - At L2-3: disc bulging with mild biforaminal stenosis. - At L3-4: disc bulging with mild biforaminal stenosis." NCS/EMG of the legs which was normal    Patient Stated Goals  hoping to be rid of the pain    Currently in Pain?  Yes    Pain Score  2    Worst: 8/10; Best: 2/10   Pain Location  Hip    Pain Orientation  Right    Pain Descriptors / Indicators  Shooting;Stabbing    Pain Type  Chronic pain    Pain Radiating Towards  right lower leg at lateral aspect, occasionally left lower leg lateral aspect    Pain Onset  More than a month ago    Pain Frequency  Constant    Aggravating Factors   Sitting too long, standing too long, moving wrong, certain chairs,    Pain Relieving Factors  forward fold for at least 3 min    Effect of Pain on Daily Activities  Functional Limitations: it doesn't stop her from doing anything, but it hurts when she is doing it. Works out 5 days a week and she has to work through the pain. If she turns the wrong way while sleeping so she has to be careful how she turns when sleeping.         [facility]  PT Assessment - 11/15/19 0001      Assessment   Medical Diagnosis  right leg pain    Referring Provider (PT)  Glendale Chard, DO    Hand Dominance  Right    Next MD Visit  none scheduled    Prior Therapy  none for this problem prior to current episode of care      Precautions   Precautions  None  Restrictions   Weight Bearing Restrictions  No      Balance Screen   Has the patient fallen in the past 6 months  No    Has the patient had a decrease in activity level because of a fear of falling?   No    Is the patient reluctant to leave their home because of a fear of falling?   No      Home Environment   Living Environment  --   no concerns about living environment at this time.      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 Posture: generally good posture . Tremor: none . Muscle bulk: well nourished and well formed as someone who does regular strength training.   . Bed mobility: rolling, supine/prone <> sit independent with discomfort . Transfers: sit <> stand independent with discomfort  . Gait: grossly WFL for household and short community ambulation. More detailed gait analysis deferred to later date as needed.   NEUROLOGICAL Upper Motor Neuron Screen Babinski, Hoffman's, and Clonus (ankle) negative bilaterally Dermatomes . L2-S2 appears equal and intact to light touch. Myotomes . L2-S2 appears intact Deep Tendon Reflexes R/L  . 2+/2+ Quadriceps reflex (L4) . 2+/2+  Achilles reflex (S1)  SPINE MOTION Lumbar Spine AROM *Indicates pain Flexion: = fingers touch toes 100%. Extension: = 100% no pain. Rotation: B = WNL Side Flexion: B = WNL  PERIPHERAL JOINT MOTION (in degrees) Comments: B hips, knees, ankles grossly WNL except some mild restriction in hip extension bilaterally.   MUSCLE PERFORMANCE (MMT):  *Indicates pain 11/15/19 Date Date  Joint/Motion R/L R/L R/L  Adduction 4+/4* / /  Comments: BLE WNL approximately at least 4+/5 and pain free except left hip adduction reproduced sharp pain in R hip, limiting resistance.   SUSTAINED POSITIONS TESTING:  Position Time During  After  Comments  Standing flexion 2-3 min better better After repeated flexion in standing  Comments:   REPEATED MOTIONS TESTING: Motion/Technique sets x reps During After ROM Functional test** Stoplight Comments  Prone press up x10 No effect No effect at first, then worse when stood up    Later became worse (visibly uncomfortable)  repeated flexion in standing x10 End range relief worse      Repeated flexion in sitting x10    After static flexion in standing      SPECIAL TESTS: Straight leg raise (SLR): B = negative (prior to prone press ups) FABER: R = concordant pain in posterior hip, L = negative. Slump: R = possibly positive following prone press ups (not responsive to sensitizing maneuver but definitely worse when placed in stretched position).  SIJ Tests: - compression = negative - distraction = negative - thigh thrust R = negative - sacral thrust = negative  ACCESSORY MOTION:  - Negative for pain to CPA at any lumbar level or sacrum  PALPATION: - TTP with reproduction of  symptoms at R deep hip rotator group, near sciatic notch.  EDUCATION/COGNITION: Patient is alert and oriented X 4.  Objective measurements completed on examination: See above findings.    TREATMENT:  Denies history of spinal surgery or trauma.   Therapeutic exercise: to centralize  symptoms and improve ROM, strength, muscular endurance, and activity tolerance required for successful completion of functional activities.  - Education on diagnosis, prognosis, POC, anatomy and physiology of current condition.  - Education on HEP including handout  - repeated standing lumbar flexion x 10 - sustained standing flexion x 3 min - repeated lumbar flexion in sitting with self overpressure x 10.   Manual therapy: to reduce pain and tissue tension, improve range of motion, neuromodulation, in order to promote improved ability to complete functional activities. - prone STM to right glute region - increased discomfort so discontinued.   HOME EXERCISE PROGRAM       PT Education - 11/15/19 1019    Education Details  Exercise purpose/form. Self management techniques. Education on diagnosis, prognosis, POC, anatomy and physiology of current condition Education on HEP including handout    Person(s) Educated  Patient    Methods  Explanation;Demonstration;Tactile cues;Verbal cues;Handout    Comprehension  Verbalized understanding;Returned demonstration;Verbal cues required;Tactile cues required;Need further instruction       PT Short Term Goals - 11/15/19 1244      PT SHORT TERM GOAL #1   Title  Be independent with initial home exercise program for self-management of symptoms.    Baseline  intial HEP provided at IE (11/15/2019);    Time  2    Period  Weeks    Status  New    Target Date  11/29/19        PT Long Term Goals - 11/15/19 1245      PT LONG TERM GOAL #1   Title  Be independent with a long-term home exercise program for self-management of symptoms.    Baseline  initial HEP provided at IE (11/15/2019);    Time  6    Period  Weeks    Status  New    Target Date  12/27/19      PT LONG TERM GOAL #2   Title  Reduce pain with functional activities to equal or less than 1/10 to allow patient to complete usual activities including ADLs, IADLs, and social engagement with  less difficulty.    Baseline  8/10 (11/15/2019);    Time  6    Period  Weeks    Status  New    Target Date  01/10/20      PT LONG TERM GOAL #3   Title  Demonstrate improved FOTO score to equal or greater than 85 to demonstrate improvement in overall condition and self-reported functional ability.    Baseline  FOTO = 80 (11/15/2019);    Time  6    Period  Weeks    Status  New    Target Date  12/27/19      PT LONG TERM GOAL #4   Title  Patient will demonstrate hip strength of equal or greater than 4+/5 without increased pain to improve ability to perform functional activities and workout routines without limitation.    Baseline  L hip adduction 4/5 with pain in R glute (11/15/2019);    Time  6    Period  Weeks    Status  New    Target Date  12/27/19      PT LONG TERM GOAL #  5   Title  Complete community, work and/or recreational activities without limitation due to current condition.    Baseline  difficulty with usual activities such as prolonged sitting (work, travel), prolonged standing (cooking, housework), exercise routine, and sleeping (11/15/2019);    Time  6    Period  Weeks    Status  New    Target Date  12/27/19             Plan - 11/15/19 1025    Clinical Impression Statement  Patient is a 59 y.o. female referred to outpatient physical therapy with a medical diagnosis of right leg pain who presents with signs and symptoms consistent with pain from right glute to left lower leg and intermittent left lower leg pain with likely lumbar flexion preference. Pain appears to be radiating from lumbar spine or glute region on the right. Bilateral symptoms and changes with lumbar flexon suggest lumbar contributor to pain. Patient has documented grade 2 spondylolisthesis according to MRI and may be experiencing intermittent nerve irritation with different positions due to instability at this level. Patient presents with significant pain, ROM, activity tolerance, muscle spasm, neurodynamic,  muscle performance (strength/power/endurance) impairments that are limiting ability to complete usual activities such as prolonged sitting (work, travel), prolonged standing (cooking, housework), exercise routine, and sleeping without difficulty and decreases her quality of life. Patient will benefit from skilled physical therapy intervention to address current body structure impairments and activity limitations to improve function and work towards goals set in current POC in order to return to prior level of function or maximal functional improvement.    Personal Factors and Comorbidities  Comorbidity 3+;Past/Current Experience;Time since onset of injury/illness/exacerbation    Examination-Activity Limitations  Sit;Bed Mobility;Squat;Lift;Stand;Sleep;Other   rolonged sitting (work, travel), prolonged standing (cooking, housework), exercise routine, and sleeping   Examination-Participation Restrictions  Yard Work;Community Activity;Cleaning;Driving;Meal Prep   work, fitness routine   Stability/Clinical Decision Making  Stable/Uncomplicated    Clinical Decision Making  Low    Rehab Potential  Good    PT Frequency  2x / week    PT Duration  6 weeks    PT Treatment/Interventions  ADLs/Self Care Home Management;Cryotherapy;Moist Heat;Electrical Stimulation;Therapeutic activities;Therapeutic exercise;Neuromuscular re-education;Patient/family education;Manual techniques;Passive range of motion;Dry needling;Spinal Manipulations;Joint Manipulations    PT Next Visit Plan  manual, flexion based exercises    PT Home Exercise Plan  repeated flexion in sitting    Consulted and Agree with Plan of Care  Patient       Patient will benefit from skilled therapeutic intervention in order to improve the following deficits and impairments:  Increased muscle spasms, Decreased range of motion, Impaired perceived functional ability, Pain, Decreased activity tolerance, Decreased strength, Hypermobility, Decreased  endurance, Hypomobility, Increased fascial restricitons, Impaired flexibility  Visit Diagnosis: Sciatica, right side  Sciatica, left side  Pain in right leg     Problem List Patient Active Problem List   Diagnosis Date Noted  . Family history of malignant neoplasm of gastrointestinal tract   . Pain in both lower extremities 12/30/2018  . Paresthesia 12/30/2018  . Right lumbar radiculopathy 11/10/2018    Luretha Murphy. Ilsa Iha, PT, DPT 11/15/19, 12:52 PM  [facility] [address], [place], [address] Phone: [phone]   Fax:  [phone]  Name: Dana Rosales MRN: [id number] Date of Birth: [birth year]

## 2019-11-20 ENCOUNTER — Ambulatory Visit: Payer: BC Managed Care – PPO | Admitting: Physical Therapy

## 2019-11-22 ENCOUNTER — Ambulatory Visit: Payer: BC Managed Care – PPO | Admitting: Physical Therapy

## 2019-11-27 ENCOUNTER — Encounter: Payer: BC Managed Care – PPO | Admitting: Physical Therapy

## 2019-11-29 ENCOUNTER — Encounter: Payer: BC Managed Care – PPO | Admitting: Physical Therapy

## 2019-12-04 ENCOUNTER — Encounter: Payer: BC Managed Care – PPO | Admitting: Physical Therapy

## 2019-12-06 ENCOUNTER — Encounter: Payer: BC Managed Care – PPO | Admitting: Physical Therapy

## 2019-12-11 ENCOUNTER — Encounter: Payer: BC Managed Care – PPO | Admitting: Physical Therapy

## 2019-12-12 ENCOUNTER — Ambulatory Visit: Payer: BC Managed Care – PPO | Admitting: Physical Therapy

## 2019-12-13 ENCOUNTER — Encounter: Payer: BC Managed Care – PPO | Admitting: Physical Therapy

## 2019-12-13 ENCOUNTER — Ambulatory Visit: Payer: BC Managed Care – PPO | Admitting: Physical Therapy

## 2019-12-18 ENCOUNTER — Encounter: Payer: BC Managed Care – PPO | Admitting: Physical Therapy

## 2019-12-19 ENCOUNTER — Encounter: Payer: BC Managed Care – PPO | Admitting: Physical Therapy

## 2019-12-20 ENCOUNTER — Encounter: Payer: BC Managed Care – PPO | Admitting: Physical Therapy

## 2019-12-21 ENCOUNTER — Encounter: Payer: BC Managed Care – PPO | Admitting: Physical Therapy

## 2019-12-25 ENCOUNTER — Encounter: Payer: BC Managed Care – PPO | Admitting: Physical Therapy

## 2019-12-26 ENCOUNTER — Encounter: Payer: BC Managed Care – PPO | Admitting: Physical Therapy

## 2019-12-27 ENCOUNTER — Encounter: Payer: BC Managed Care – PPO | Admitting: Physical Therapy

## 2019-12-28 ENCOUNTER — Encounter: Payer: BC Managed Care – PPO | Admitting: Physical Therapy

## 2020-01-02 ENCOUNTER — Encounter: Payer: BC Managed Care – PPO | Admitting: Physical Therapy

## 2020-01-04 ENCOUNTER — Encounter: Payer: BC Managed Care – PPO | Admitting: Physical Therapy

## 2020-01-09 ENCOUNTER — Encounter: Payer: BC Managed Care – PPO | Admitting: Physical Therapy

## 2020-01-11 ENCOUNTER — Encounter: Payer: BC Managed Care – PPO | Admitting: Physical Therapy

## 2020-02-01 ENCOUNTER — Encounter: Payer: Self-pay | Admitting: Physical Therapy

## 2020-02-01 DIAGNOSIS — M5431 Sciatica, right side: Secondary | ICD-10-CM

## 2020-02-01 DIAGNOSIS — M79604 Pain in right leg: Secondary | ICD-10-CM

## 2020-02-01 DIAGNOSIS — M5432 Sciatica, left side: Secondary | ICD-10-CM

## 2020-02-01 NOTE — Therapy (Signed)
[facility] [address], [place], [address] Phone: [phone]   Fax:  [phone]  Physical Therapy No Visit Discharge Summary Reporting period: 11/15/2019 - 12/27/2019  Patient Details  Name: Dana Rosales MRN: [id number] Date of Birth: 27-Oct-1960 Referring Provider (PT): Alda Berthold, DO   Encounter Date: 02/01/2020    Past Medical History:  Diagnosis Date  . H. pylori infection   . Hypothyroid   . Leg pain    bilateral  . Right hip pain     Past Surgical History:  Procedure Laterality Date  . COLONOSCOPY WITH PROPOFOL N/A 10/19/2019   Procedure: COLONOSCOPY WITH PROPOFOL;  Surgeon: Virgel Manifold, MD;  Location: [facility] ENDOSCOPY;  Service: Endoscopy;  Laterality: N/A;    There were no vitals filed for this visit.  Subjective Assessment - 02/01/20 1721    Subjective  Patient attended initial eval only. Is now discharged from PT due to lack of continued participation.    Pertinent History  Patient is a 59 y.o. female who presents to outpatient physical therapy with a referral for medical diagnosis right leg pain. This patient's chief complaints consist of right hip pain with radiating leg pain R > L, leading to the following functional deficits:  it doesn't stop her from doing anything, but it hurts when she is doing it. Works out 5 days a week and she has to work through the pain. If she turns the wrong way while sleeping so she has to be careful how she turns when sleeping. Has limitations in standing, sitting, traveling. Relevant past medical history and comorbidities include hypothyroidism, right lumbar radiculopathy, right hip pain. Patient denies history of major cardiac event, stroke, seizure, diabetes, lung problems, cancer, bowel/bladder changes.    Limitations  Sitting;Standing;Other (comment)   it doesn't stop her, but it hurts. Works out 5 days a week and she has to work through the pain. If  she turns the wrong way while sleeping so she has to be careful how she turns when sleeping. Has limitations in standing, sitting, traveling.   How long can you sit comfortably?  maybe 10-15 min    How long can you stand comfortably?  30-45 min    Diagnostic tests  Lumbar MRI report 11/16/2018: "IMPRESSION: Abnormal MRI lumbar spine (without) demonstrating: - At L4-5: pseudo-disc bulging with mild spinal stenosis and moderate biforaminal stenosis; grade 2 spondylolisthesis of L4 anterior to L5 measuring 48m. - At L2-3: disc bulging with mild biforaminal stenosis. - At L3-4: disc bulging with mild biforaminal stenosis." NCS/EMG of the legs which was normal    Patient Stated Goals  hoping to be rid of the pain    Pain Onset  More than a month ago       OBJECTIVE Patient is not present for examination at this time. Please see previous documentation for latest objective data.     PT Short Term Goals - 02/01/20 1722      PT SHORT TERM GOAL #1   Title  Be independent with initial home exercise program for self-management of symptoms.    Baseline  intial HEP provided at IE (11/15/2019);    Time  2    Period  Weeks    Status  Unable to assess    Target Date  11/29/19        PT Long Term Goals - 02/01/20 1722      PT LONG TERM GOAL #1   Title  Be independent with a long-term home exercise program for self-management of symptoms.    Baseline  initial HEP provided at IE (11/15/2019);    Time  6    Period  Weeks    Status  Not Met    Target Date  12/27/19      PT LONG TERM GOAL #2   Title  Reduce pain with functional activities to equal or less than 1/10 to allow patient to complete usual activities including ADLs, IADLs, and social engagement with less difficulty.    Baseline  8/10 (11/15/2019);    Time  6    Period  Weeks    Status  Not Met    Target Date  12/27/19      PT LONG TERM GOAL #3   Title  Demonstrate improved FOTO score to equal or greater than 85 to demonstrate improvement in  overall condition and self-reported functional ability.    Baseline  FOTO = 80 (11/15/2019);    Time  6    Period  Weeks    Status  Not Met    Target Date  12/27/19      PT LONG TERM GOAL #4   Title  Patient will demonstrate hip strength of equal or greater than 4+/5 without increased pain to improve ability to perform functional activities and workout routines without limitation.    Baseline  L hip adduction 4/5 with pain in R glute (11/15/2019);    Time  6    Period  Weeks    Status  Not Met    Target Date  12/27/19      PT LONG TERM GOAL #5   Title  Complete community, work and/or recreational activities without limitation due to current condition.    Baseline  difficulty with usual activities such as prolonged sitting (work, travel), prolonged standing (cooking, housework), exercise routine, and sleeping (11/15/2019);    Time  6    Period  Weeks    Status  Not Met    Target Date  12/27/19       Patient attended initial PT eval only and did not return for further care. Therefore, she was not able to continue working towards goals in the clinic and is being discharged from PT for this reason.      Plan - 02/01/20 1724    Clinical Impression Statement  Patient attended initial PT eval only and did not return for further care. Therefore, she was not able to continue working towards goals in the clinic and is being discharged from PT for this reason.    Personal Factors and Comorbidities  Comorbidity 3+;Past/Current Experience;Time since onset of injury/illness/exacerbation    Examination-Activity Limitations  Sit;Bed Mobility;Squat;Lift;Stand;Sleep;Other   rolonged sitting (work, travel), prolonged standing (cooking, housework), exercise routine, and sleeping   Examination-Participation Restrictions  Yard Work;Community Activity;Cleaning;Driving;Meal Prep   work, fitness routine   Stability/Clinical Decision Making  Stable/Uncomplicated    Rehab Potential  Good    PT Frequency  2x /  week    PT Duration  6 weeks    PT Treatment/Interventions  ADLs/Self Care Home Management;Cryotherapy;Moist Heat;Electrical Stimulation;Therapeutic activities;Therapeutic exercise;Neuromuscular re-education;Patient/family education;Manual techniques;Passive range of motion;Dry needling;Spinal Manipulations;Joint Manipulations    PT Next Visit Plan  patient is now discharged from physical therapy due to lack of continued participation.    PT Home Exercise Plan  repeated flexion in sitting    Consulted and Agree with Plan of Care  Patient       Patient  will benefit from skilled therapeutic intervention in order to improve the following deficits and impairments:  Increased muscle spasms, Decreased range of motion, Impaired perceived functional ability, Pain, Decreased activity tolerance, Decreased strength, Hypermobility, Decreased endurance, Hypomobility, Increased fascial restricitons, Impaired flexibility  Visit Diagnosis: Sciatica, right side  Sciatica, left side  Pain in right leg     Problem List Patient Active Problem List   Diagnosis Date Noted  . Family history of malignant neoplasm of gastrointestinal tract   . Pain in both lower extremities 12/30/2018  . Paresthesia 12/30/2018  . Right lumbar radiculopathy 11/10/2018    Everlean Alstrom. Graylon Good, PT, DPT 02/01/20, 5:25 PM  [facility] [address] [place], [address] Phone: [phone]   Fax:  [phone]  Name: Chaniya Genter MRN: [id number] Date of Birth: November 05, 1960

## 2022-01-02 ENCOUNTER — Other Ambulatory Visit: Payer: Self-pay

## 2022-01-02 ENCOUNTER — Ambulatory Visit (INDEPENDENT_AMBULATORY_CARE_PROVIDER_SITE_OTHER): Payer: BC Managed Care – PPO

## 2022-01-02 ENCOUNTER — Encounter: Payer: Self-pay | Admitting: Emergency Medicine

## 2022-01-02 ENCOUNTER — Ambulatory Visit
Admission: EM | Admit: 2022-01-02 | Discharge: 2022-01-02 | Disposition: A | Discharge status: Home / Self Care | Payer: BC Managed Care – PPO | Attending: Emergency Medicine | Admitting: Emergency Medicine

## 2022-01-02 DIAGNOSIS — M25562 Pain in left knee: Secondary | ICD-10-CM

## 2022-01-02 DIAGNOSIS — M25462 Effusion, left knee: Secondary | ICD-10-CM

## 2022-01-02 NOTE — ED Provider Notes (Signed)
?[facility] ? ? ? ?CSN: [id number] ?Arrival date & time: 01/02/22  1851 ? ? ?  ? ?History   ?Chief Complaint ?Chief Complaint  ?Patient presents with  ? Knee Injury  ? ? ?HPI ?Dana Rosales is a 61 y.o. female.  Patient presents with left knee pain and swelling since this morning.  Her symptoms started after she was jumping on a platform at the gym and landed with her knee twisted.  The pain and swelling has gotten worse over the course of the day.  The pain is worse with bending her knee.  No rash, wounds, redness, bruising, numbness, weakness, paresthesias, or other symptoms.  No treatments attempted.   ? ?The history is provided by the patient and medical records.  ? ?Past Medical History:  ?Diagnosis Date  ? H. pylori infection   ? Hypothyroid   ? Leg pain   ? bilateral  ? Right hip pain   ? ? ?Patient Active Problem List  ? Diagnosis Date Noted  ? Family history of malignant neoplasm of gastrointestinal tract   ? Pain in both lower extremities 12/30/2018  ? Paresthesia 12/30/2018  ? Right lumbar radiculopathy 11/10/2018  ? ? ?Past Surgical History:  ?Procedure Laterality Date  ? COLONOSCOPY WITH PROPOFOL N/A 10/19/2019  ? Procedure: COLONOSCOPY WITH PROPOFOL;  Surgeon: Pasty Spillersahiliani, Varnita B, MD;  Location: [facility] ENDOSCOPY;  Service: Endoscopy;  Laterality: N/A;  ? ? ?OB History   ?No obstetric history on file. ?  ? ? ? ?Home Medications   ? ?Prior to Admission medications   ?Medication Sig Start Date End Date Taking? Authorizing Provider  ?ALPRAZolam (XANAX) 0.5 MG tablet Take 1 tablet (0.5 mg total) by mouth at bedtime as needed for anxiety. Take 1-2 tablets 30 minutes prior to MRI, may repeat once as needed. Must have driver. ?Patient not taking: Reported on 10/19/2019 11/10/18   Levert FeinsteinYan, Yijun, MD  ?cyclobenzaprine (FLEXERIL) 5 MG tablet Take 1 tablet (5 mg total) by mouth at bedtime as needed for muscle spasms (right leg pain). ?Patient not taking: Reported on 11/15/2019 11/03/19   Glendale ChardPatel, Donika K, DO   ?DULoxetine (CYMBALTA) 60 MG capsule Take 1 capsule (60 mg total) by mouth daily. ?Patient not taking: Reported on 11/03/2019 11/18/18   Levert FeinsteinYan, Yijun, MD  ?gabapentin (NEURONTIN) 300 MG capsule Take 1 capsule (300 mg total) by mouth 3 (three) times daily. ?Patient not taking: Reported on 10/19/2019 11/10/18   Levert FeinsteinYan, Yijun, MD  ?levothyroxine (SYNTHROID, LEVOTHROID) 75 MCG tablet Take 75 mcg by mouth daily before breakfast.    [provider]  ?meloxicam (MOBIC) 7.5 MG tablet Take 1 tablet (7.5 mg total) by mouth daily. ?Patient not taking: Reported on 10/19/2019 12/30/18   Levert FeinsteinYan, Yijun, MD  ? ? ?Family History ?Family History  ?Problem Relation Age of Onset  ? Hypertension Mother   ? Colon cancer Father   ? Diabetes Father   ? ? ?Social History ?Social History  ? ?Tobacco Use  ? Smoking status: Former  ? Smokeless tobacco: Never  ?Vaping Use  ? Vaping Use: Never used  ?Substance Use Topics  ? Alcohol use: Yes  ?  Comment: occasional  ? Drug use: No  ? ? ? ?Allergies   ?Codeine ? ? ?Review of Systems ?Review of Systems  ?Musculoskeletal:  Positive for arthralgias, gait problem and joint swelling.  ?Skin:  Negative for color change, rash and wound.  ?Neurological:  Negative for weakness and numbness.  ?All other systems reviewed and are  negative. ? ? ?Physical Exam ?Triage Vital Signs ?ED Triage Vitals  ?Enc Vitals Group  ?   BP 01/02/22 1902 (!) 149/85  ?   Pulse Rate 01/02/22 1902 71  ?   Resp 01/02/22 1902 16  ?   Temp 01/02/22 1902 98.6 ?F (37 ?C)  ?   Temp Source 01/02/22 1902 Oral  ?   SpO2 01/02/22 1902 98 %  ?   Weight --   ?   Height --   ?   Head Circumference --   ?   Peak Flow --   ?   Pain Score 01/02/22 1901 5  ?   Pain Loc --   ?   Pain Edu? --   ?   Excl. in GC? --   ? ?No data found. ? ?Updated Vital Signs ?BP (!) 149/85 (BP Location: Right Arm)   Pulse 71   Temp 98.6 ?F (37 ?C) (Oral)   Resp 16   SpO2 98%  ? ?Visual Acuity ?Right Eye Distance:   ?Left Eye Distance:   ?Bilateral Distance:   ? ?Right  Eye Near:   ?Left Eye Near:    ?Bilateral Near:    ? ?Physical Exam ?Vitals and nursing note reviewed.  ?Constitutional:   ?   General: She is not in acute distress. ?   Appearance: She is well-developed.  ?HENT:  ?   Mouth/Throat:  ?   Mouth: Mucous membranes are moist.  ?Cardiovascular:  ?   Rate and Rhythm: Normal rate and regular rhythm.  ?   Heart sounds: Normal heart sounds.  ?Pulmonary:  ?   Effort: Pulmonary effort is normal. No respiratory distress.  ?   Breath sounds: Normal breath sounds.  ?Musculoskeletal:     ?   General: Swelling and tenderness present. No deformity.  ?   Cervical back: Neck supple.  ?   Comments: Generalized tenderness and edema of left knee.  No wounds, ecchymosis, or erythema.  ?Skin: ?   General: Skin is warm and dry.  ?   Capillary Refill: Capillary refill takes less than 2 seconds.  ?   Findings: No bruising, erythema, lesion or rash.  ?Neurological:  ?   General: No focal deficit present.  ?   Mental Status: She is alert.  ?   Sensory: No sensory deficit.  ?   Motor: No weakness.  ?   Gait: Gait abnormal.  ?   Comments: Limping gait.  ?Psychiatric:     ?   Mood and Affect: Mood normal.     ?   Behavior: Behavior normal.  ? ? ? ?UC Treatments / Results  ?Labs ?(all labs ordered are listed, but only abnormal results are displayed) ?Labs Reviewed - No data to display ? ?EKG ? ? ?Radiology ?DG Knee Complete 4 Views Left ? ?Result Date: 01/02/2022 ?CLINICAL DATA:  Jumped onto platform with subsequent knee pain, initial encounter EXAM: LEFT KNEE - COMPLETE 4+ VIEW COMPARISON:  None. FINDINGS: Small joint effusion is noted. No significant degenerative changes are seen. Mild irregularity is noted along the medial tibial plateau which may represent a fracture. This is best seen on the oblique image. CT may be helpful for further evaluation. IMPRESSION: Mild irregularity along the medial tibial plateau suspicious for undisplaced fracture. CT may be helpful for further evaluation. Small  joint effusion. Electronically Signed   By: Alcide Clever M.D.   On: 01/02/2022 19:25   ? ?Procedures ?Procedures (including critical care time) ? ?  Medications Ordered in UC ?Medications - No data to display ? ?Initial Impression / Assessment and Plan / UC Course  ?I have reviewed the triage vital signs and the nursing notes. ? ?Pertinent labs & imaging results that were available during my care of the patient were reviewed by me and considered in my medical decision making (see chart for details). ? ?Pain and swelling of left knee.  X-ray shows possible nondisplaced fracture of tibial plateau; radiologist recommends CT scan.  Discussed with patient and she declines transfer to the ED at this time.  She will follow-up with orthopedics tomorrow at [facility] walk-in clinic.  Treating with ibuprofen, rest, elevation, ice packs, knee immobilizer, crutches.  Patient agrees to plan of care. ? ? ?Final Clinical Impressions(s) / UC Diagnoses  ? ?Final diagnoses:  ?Pain and swelling of left knee  ? ? ? ?Discharge Instructions   ? ?  ?Your xray shows a possible fracture.   ? ?Take ibuprofen as directed.  Rest and elevate your knee.  Apply ice packs 2-3 times a day for up to 20 minutes each.  Wear the knee immobilizer and use the crutches.   ? ?Follow up with an orthopedist tomorrow.   ? ? ? ? ? ? ? ?ED Prescriptions   ?None ?  ? ?PDMP not reviewed this encounter. ?  ?Mickie Bail, NP ?01/02/22 1953 ? ?

## 2022-01-02 NOTE — ED Triage Notes (Signed)
Pt injured left knee jumping on a platform at the gym today.  ?

## 2022-01-02 NOTE — Discharge Instructions (Addendum)
Your xray shows a possible fracture.   ? ?Take ibuprofen as directed.  Rest and elevate your knee.  Apply ice packs 2-3 times a day for up to 20 minutes each.  Wear the knee immobilizer and use the crutches.   ? ?Follow up with an orthopedist tomorrow.   ? ? ? ?

## 2022-01-06 ENCOUNTER — Other Ambulatory Visit: Payer: Self-pay | Admitting: Student

## 2022-01-06 DIAGNOSIS — S82102A Unspecified fracture of upper end of left tibia, initial encounter for closed fracture: Secondary | ICD-10-CM

## 2022-01-16 ENCOUNTER — Ambulatory Visit
Admission: RE | Admit: 2022-01-16 | Discharge: 2022-01-16 | Disposition: A | Discharge status: Home / Self Care | Payer: BC Managed Care – PPO | Source: Ambulatory Visit | Attending: Student | Admitting: Student

## 2022-01-16 DIAGNOSIS — S82102A Unspecified fracture of upper end of left tibia, initial encounter for closed fracture: Secondary | ICD-10-CM | POA: Diagnosis present

## 2022-10-10 IMAGING — CT CT KNEE*L* W/O CM
4 of 6 series · 13 of 33 positions shown, 15 images · non-contrast
Comparison: Radiographs 01/02/2022

CLINICAL DATA: Recent E injury.

EXAM:
CT OF THE LEFT KNEE WITHOUT CONTRAST
TECHNIQUE: Multidetector CT imaging of the left knee was performed according to
the standard protocol. Multiplanar CT image reconstructions were
also generated.
RADIATION DOSE REDUCTION: This exam was performed according to the
departmental dose-optimization program which includes automated
exposure control, adjustment of the mA and/or kV according to
patient size and/or use of iterative reconstruction technique.

[Series 2: axial bone · axial · 0.33mm/px · z∈[-1402,-1250]mm · 5 of 115 slices shown, 7 images]
[im 20/115  soft-tissue]
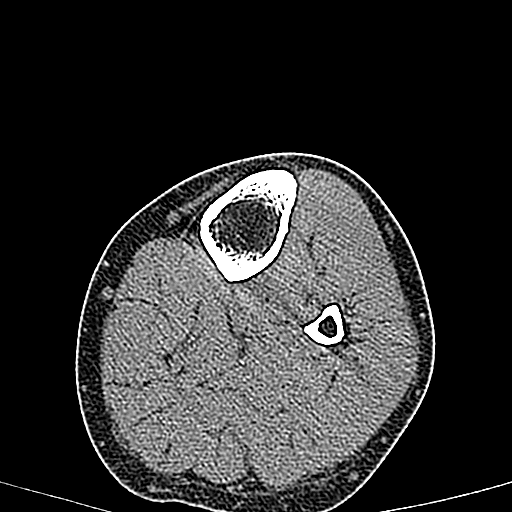
[im 20/115  bone]
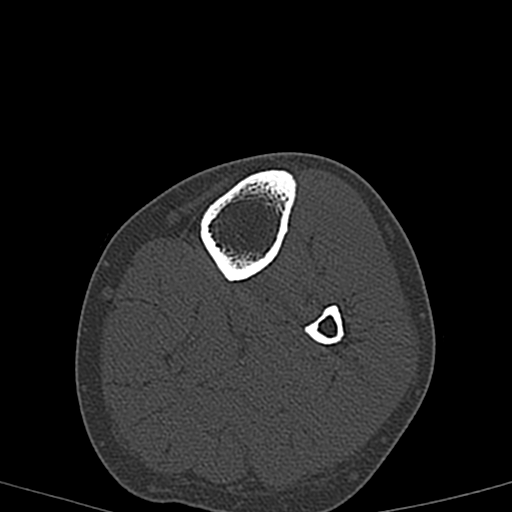
[im 39/115  bone]
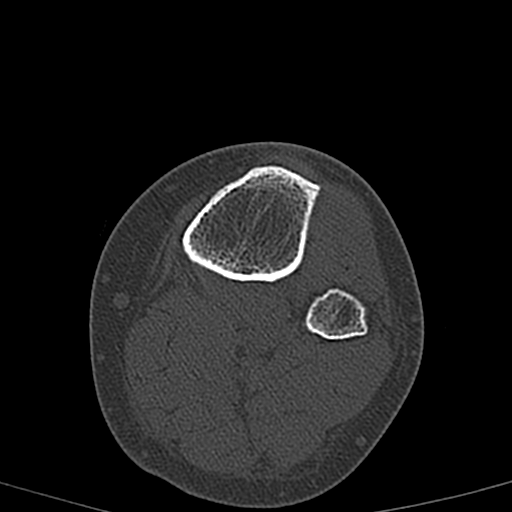
[im 58/115  bone]
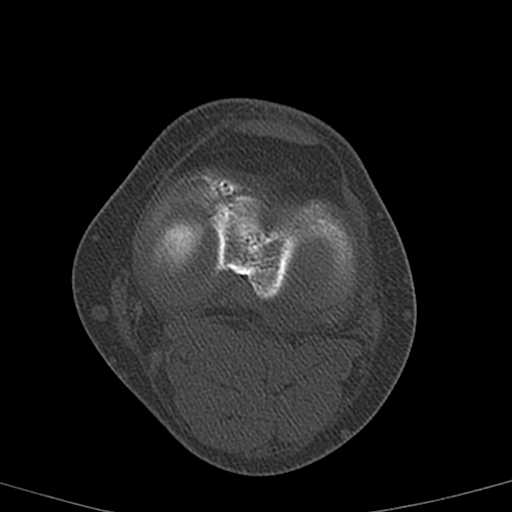
[im 77/115  bone]
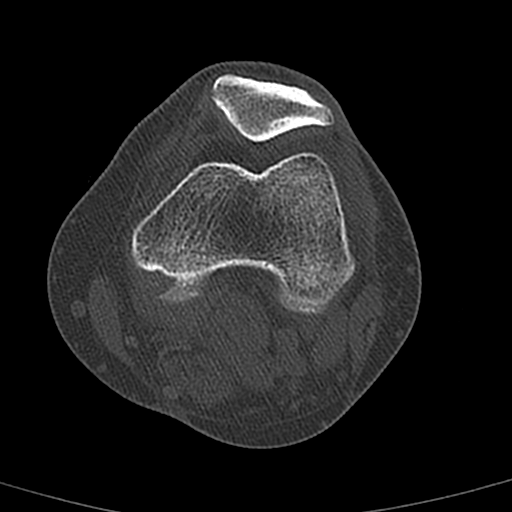
[im 96/115  soft-tissue]
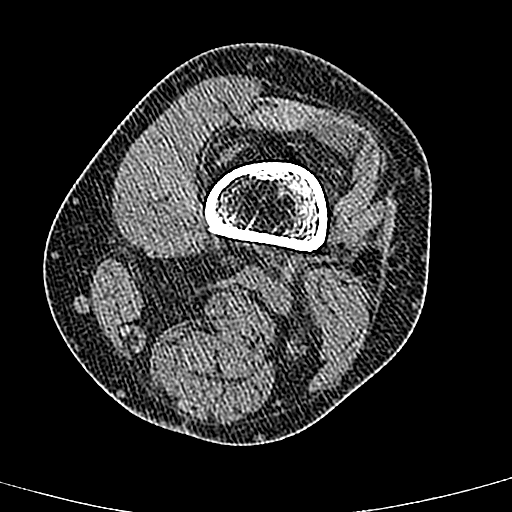
[im 96/115  bone]
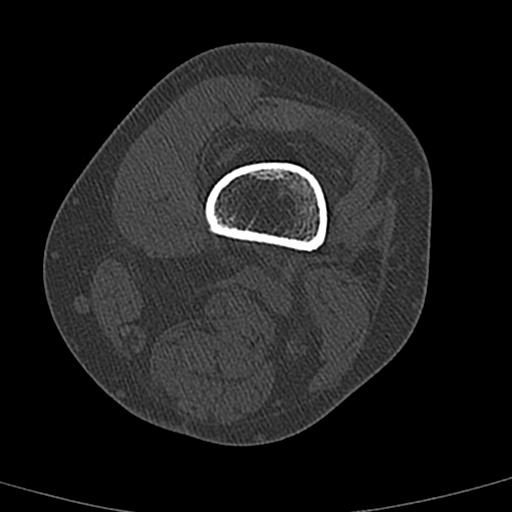

[Series 3: axial st · axial · 0.33mm/px · z∈[-1402,-1364]mm · 2 of 115 slices shown]
[im 20/115  bone]
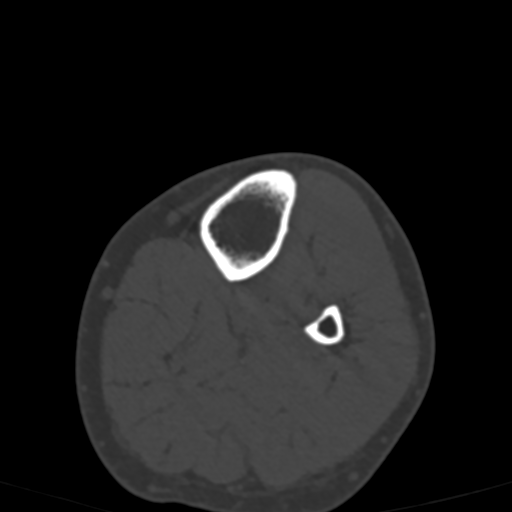
[im 39/115  bone]
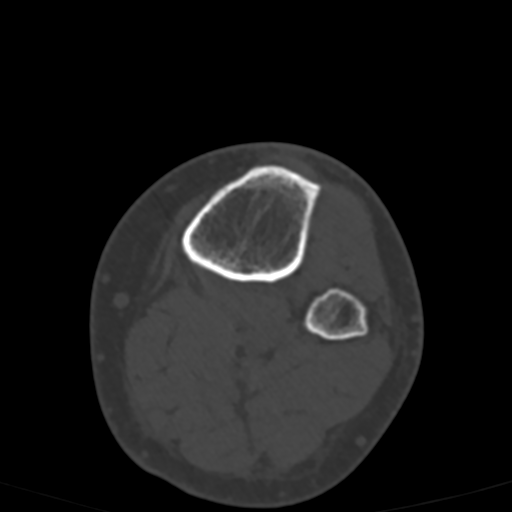

[Series 6: coronal bone · coronal · 0.25mm/px · 1 of 67 slices shown]
[im 34/67  bone]
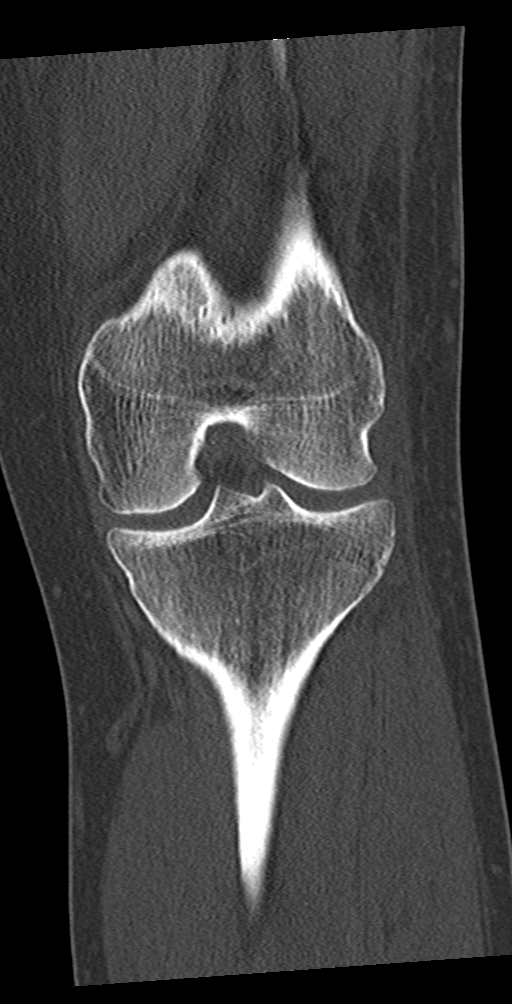

[Series 8: sagittal bone · sagittal · 0.26mm/px · 5 of 64 slices shown]
[im 11/64  bone]
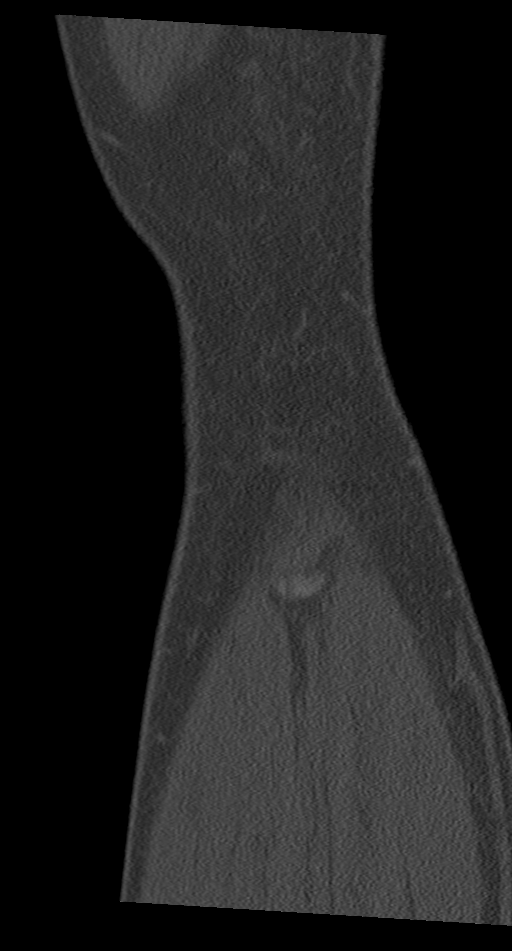
[im 22/64  bone]
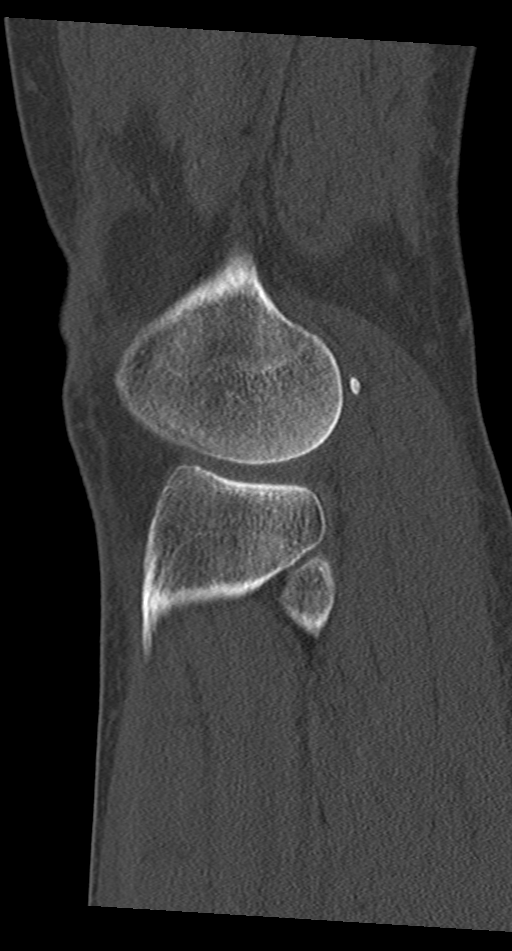
[im 32/64  bone]
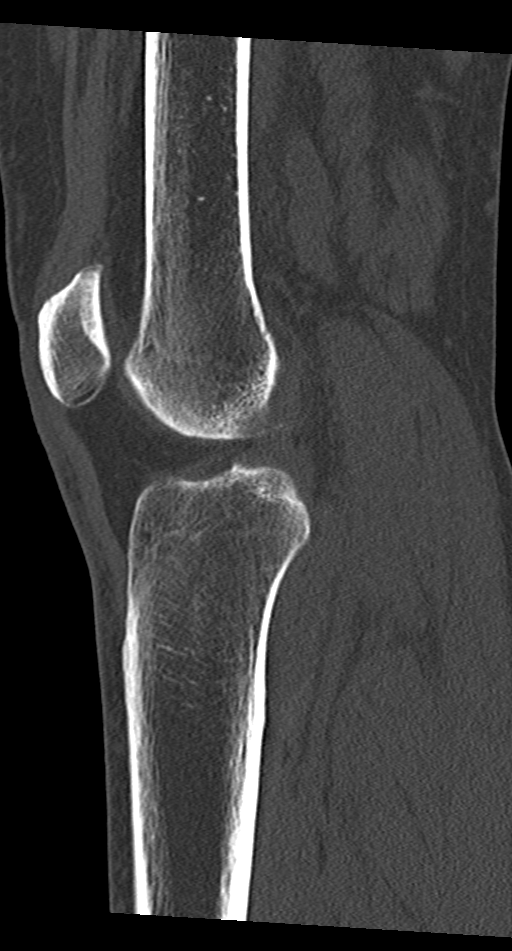
[im 43/64  bone]
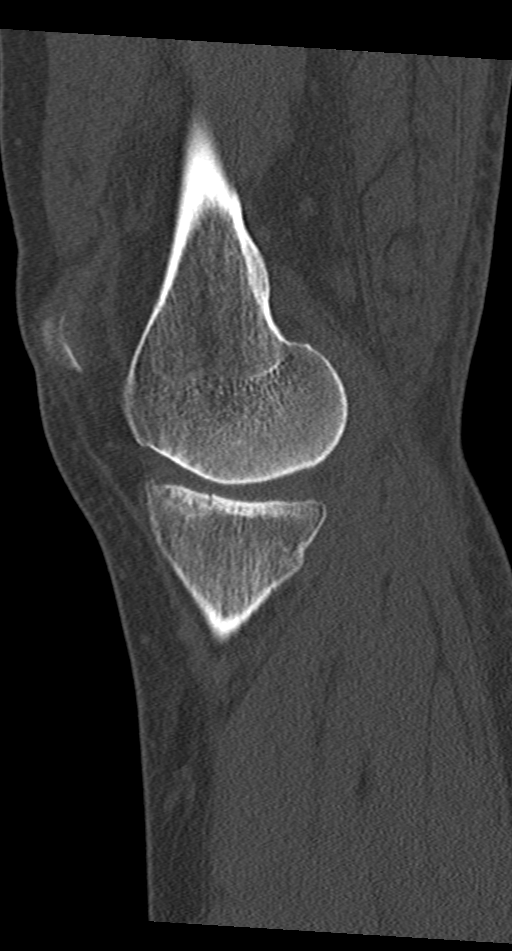
[im 53/64  bone]
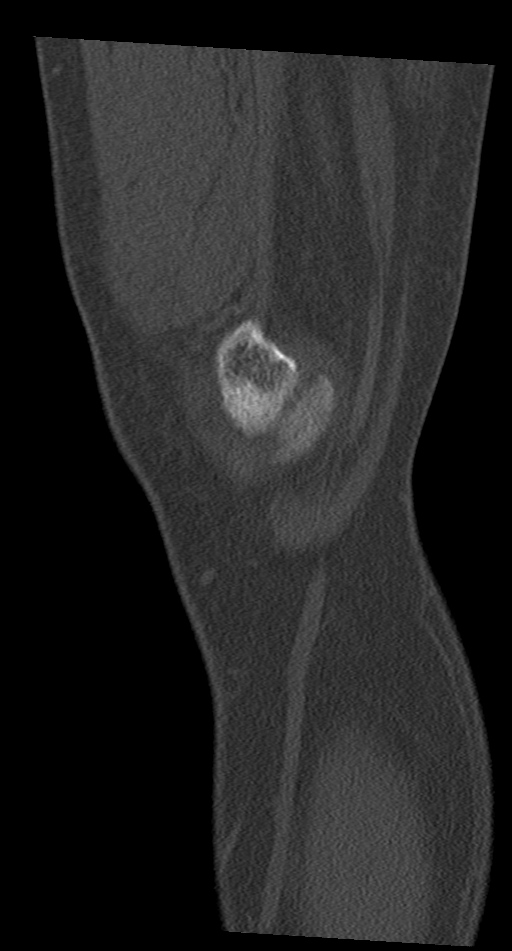

[13 of 33 positions shown; findings below may reference images not displayed]

FINDINGS: The joint spaces are maintained.

Subtle die punch type impaction fracture involving the medial tibial
plateau with minimal depression of 1 mm. Associated overlapping
trabeculae. Associated small joint effusion.

No other fractures are identified. No significant degenerative
changes. Grossly by CT the cruciate collateral ligaments are intact.
The quadriceps and patellar tendons are intact.
IMPRESSION: Subtle focal impaction type fracture involving the medial tibial
plateau.

## 2023-09-01 ENCOUNTER — Emergency Department
Admission: EM | Admit: 2023-09-01 | Discharge: 2023-09-01 | Disposition: A | Discharge status: Home / Self Care | Payer: BC Managed Care – PPO | Attending: Emergency Medicine | Admitting: Emergency Medicine

## 2023-09-01 ENCOUNTER — Other Ambulatory Visit: Payer: Self-pay

## 2023-09-01 DIAGNOSIS — Y9239 Other specified sports and athletic area as the place of occurrence of the external cause: Secondary | ICD-10-CM | POA: Diagnosis not present

## 2023-09-01 DIAGNOSIS — E039 Hypothyroidism, unspecified: Secondary | ICD-10-CM | POA: Insufficient documentation

## 2023-09-01 DIAGNOSIS — S39012A Strain of muscle, fascia and tendon of lower back, initial encounter: Secondary | ICD-10-CM | POA: Diagnosis not present

## 2023-09-01 DIAGNOSIS — X500XXA Overexertion from strenuous movement or load, initial encounter: Secondary | ICD-10-CM | POA: Insufficient documentation

## 2023-09-01 DIAGNOSIS — M545 Low back pain, unspecified: Secondary | ICD-10-CM | POA: Diagnosis present

## 2023-09-01 MED ORDER — OXYCODONE-ACETAMINOPHEN 5-325 MG PO TABS
1.0000 | ORAL_TABLET | Freq: Once | ORAL | Status: AC
  Administered 2023-09-01: 1 via ORAL
  Filled 2023-09-01: qty 1

## 2023-09-01 MED ORDER — ONDANSETRON 4 MG PO TBDP: 4.0000 mg | ORAL_TABLET | Freq: Four times a day (QID) | ORAL | 0 refills | Status: AC | PRN

## 2023-09-01 MED ORDER — OXYCODONE-ACETAMINOPHEN 5-325 MG PO TABS
2.0000 | ORAL_TABLET | Freq: Three times a day (TID) | ORAL | 0 refills | Status: AC | PRN
Start: 2023-09-01 — End: 2024-08-31

## 2023-09-01 MED ORDER — IBUPROFEN 800 MG PO TABS
800.0000 mg | ORAL_TABLET | Freq: Once | ORAL | Status: AC
  Administered 2023-09-01: 800 mg via ORAL
  Filled 2023-09-01: qty 1

## 2023-09-01 MED ORDER — METHOCARBAMOL 500 MG PO TABS
500.0000 mg | ORAL_TABLET | Freq: Once | ORAL | Status: AC
  Administered 2023-09-01: 500 mg via ORAL
  Filled 2023-09-01: qty 1

## 2023-09-01 MED ORDER — METHOCARBAMOL 500 MG PO TABS: 500.0000 mg | ORAL_TABLET | Freq: Three times a day (TID) | ORAL | 0 refills | Status: AC | PRN

## 2023-09-01 MED ORDER — ONDANSETRON 4 MG PO TBDP
4.0000 mg | ORAL_TABLET | Freq: Once | ORAL | Status: AC
  Administered 2023-09-01: 4 mg via ORAL
  Filled 2023-09-01: qty 1

## 2023-09-01 MED ORDER — IBUPROFEN 800 MG PO TABS: 800.0000 mg | ORAL_TABLET | Freq: Three times a day (TID) | ORAL | 0 refills | Status: AC | PRN

## 2023-09-01 NOTE — ED Triage Notes (Signed)
Pt reports hurting her back on Friday at the gym while she was lifting, pt states pain went away but came back this morning worse. Pt c/o lower back pain, pt ambulatory.

## 2023-09-01 NOTE — ED Provider Notes (Signed)
[facility] Provider Note    Event Date/Time   First MD Initiated Contact with Patient 09/01/23 [phone]     (approximate)   History   Back Pain   HPI  Dana Rosales is a 62 y.o. female with history of hypothyroidism who presents to the emergency department with diffuse lower back pain that started after lifting something heavy in the gym.  Denies any other injury.  No falls.  No numbness, tingling, focal weakness, bowel or bladder incontinence, fever.  No previous back surgeries or epidural injections.  Has been taking Tylenol, ibuprofen over-the-counter with some relief.  Did take one of her husbands oxycodone tablets but states that she has to be cautious with these medications as they make her very nauseated.  She is able to ambulate but it causes more pain.   History provided by patient, husband.    Past Medical History:  Diagnosis Date   H. pylori infection    Hypothyroid    Leg pain    bilateral   Right hip pain     Past Surgical History:  Procedure Laterality Date   COLONOSCOPY WITH PROPOFOL N/A 10/19/2019   Procedure: COLONOSCOPY WITH PROPOFOL;  Surgeon: Pasty Spillers, MD;  Location: [facility] ENDOSCOPY;  Service: Endoscopy;  Laterality: N/A;    MEDICATIONS:  Prior to Admission medications   Medication Sig Start Date End Date Taking? Authorizing Provider  ALPRAZolam Prudy Feeler) 0.5 MG tablet Take 1 tablet (0.5 mg total) by mouth at bedtime as needed for anxiety. Take 1-2 tablets 30 minutes prior to MRI, may repeat once as needed. Must have driver. Patient not taking: Reported on 10/19/2019 11/10/18   Levert Feinstein, MD  cyclobenzaprine (FLEXERIL) 5 MG tablet Take 1 tablet (5 mg total) by mouth at bedtime as needed for muscle spasms (right leg pain). Patient not taking: Reported on 11/15/2019 11/03/19   Nita Sickle K, DO  DULoxetine (CYMBALTA) 60 MG capsule Take 1 capsule (60 mg total) by mouth daily. Patient not taking: Reported on 11/03/2019 11/18/18    Levert Feinstein, MD  gabapentin (NEURONTIN) 300 MG capsule Take 1 capsule (300 mg total) by mouth 3 (three) times daily. Patient not taking: Reported on 10/19/2019 11/10/18   Levert Feinstein, MD  levothyroxine (SYNTHROID, LEVOTHROID) 75 MCG tablet Take 75 mcg by mouth daily before breakfast.    [provider]  meloxicam (MOBIC) 7.5 MG tablet Take 1 tablet (7.5 mg total) by mouth daily. Patient not taking: Reported on 10/19/2019 12/30/18   Levert Feinstein, MD    Physical Exam   Triage Vital Signs: ED Triage Vitals  Encounter Vitals Group     BP 09/01/23 0514 (!) 137/98     Systolic BP Percentile --      Diastolic BP Percentile --      Pulse Rate 09/01/23 0514 65     Resp 09/01/23 0514 18     Temp 09/01/23 0514 98 F (36.7 C)     Temp src --      SpO2 09/01/23 0514 98 %     Weight 09/01/23 0513 155 lb (70.3 kg)     Height 09/01/23 0513 5\' 5"  (1.651 m)     Head Circumference --      Peak Flow --      Pain Score 09/01/23 0513 10     Pain Loc --      Pain Education --      Exclude from Growth Chart --     Most recent  vital signs: Vitals:   09/01/23 0514 09/01/23 0616  BP: (!) 137/98 131/86  Pulse: 65 63  Resp: 18 18  Temp: 98 F (36.7 C) 98.1 F (36.7 C)  SpO2: 98% 98%     CONSTITUTIONAL: Alert and responds appropriately to questions. Well-appearing; well-nourished HEAD: Normocephalic, atraumatic EYES: Conjunctivae clear, pupils appear equal ENT: normal nose; moist mucous membranes NECK: Normal range of motion CARD: Regular rate and rhythm RESP: Normal chest excursion without splinting or tachypnea; no hypoxia or respiratory distress, speaking full sentences ABD/GI: non-distended BACK: No midline spinal tenderness, step-off, deformity, soft tissue swelling, ecchymosis, rash or other lesions; tender to palpation over the thoracic and lumbar paraspinal muscles which felt tight to palpation EXT: Normal ROM in all joints, no major deformities noted SKIN: Normal color for age and  race, no rashes on exposed skin NEURO: Moves all extremities equally, normal speech, no facial asymmetry noted, normal sensation, no saddle anesthesia, 2+ deep tendon reflexes in bilateral lower extremities, no clonus, normal gait PSYCH: The patient's mood and manner are appropriate. Grooming and personal hygiene are appropriate.  ED Results / Procedures / Treatments   LABS: (all labs ordered are listed, but only abnormal results are displayed) Labs Reviewed - No data to display   EKG:   RADIOLOGY: My personal review and interpretation of imaging:    I have personally reviewed all radiology reports. No results found.   PROCEDURES:  Critical Care performed: No    Procedures    IMPRESSION / MDM / ASSESSMENT AND PLAN / ED COURSE  I reviewed the triage vital signs and the nursing notes.   Patient here with lumbar strain.     DIFFERENTIAL DIAGNOSIS (includes but not limited to):   Lumbar strain, doubt fracture, cauda equina, epidural abscess or hematoma, discitis or osteomyelitis, transverse myelitis, radiculopathy  Patient's presentation is most consistent with acute complicated illness / injury requiring diagnostic workup.  PLAN: Patient here with thoracic and lumbar strain.  Will provide with anti-inflammatories, pain medication, muscle relaxers.  No indication for emergent imaging.  She has no red flag symptoms.  No midline tenderness or trauma to the back to suggest fracture.  Neurologically intact here, able to ambulate.  Will discharge with pain medication and recommended close follow-up with her PCP if symptoms not improving.  Recommended alternating heat and ice, stretching, gentle massage and no heavy lifting until symptoms have resolved.  Patient and husband comfortable with this plan.   We discussed importance of not taking muscle relaxers and narcotic pain medication at the same time due to these medications making her drowsy.  Also discussed taking Zofran 15 to  20 minutes prior to taking narcotic pain medication and then only taking 1 tablet of Percocet and if no improvement in pain in 30 minutes to take a second and that she can do this every 8 hours.  MEDICATIONS GIVEN IN ED: Medications  methocarbamol (ROBAXIN) tablet 500 mg (500 mg Oral Given 09/01/23 0608)  ibuprofen (ADVIL) tablet 800 mg (800 mg Oral Given 09/01/23 9562)  oxyCODONE-acetaminophen (PERCOCET/ROXICET) 5-325 MG per tablet 1 tablet (1 tablet Oral Given 09/01/23 1308)  ondansetron (ZOFRAN-ODT) disintegrating tablet 4 mg (4 mg Oral Given 09/01/23 0608)     ED COURSE:  At this time, I do not feel there is any life-threatening condition present. I reviewed all nursing notes, vitals, pertinent previous records.  All lab and urine results, EKGs, imaging ordered have been independently reviewed and interpreted by myself.  I reviewed all available  radiology reports from any imaging ordered this visit.  Based on my assessment, I feel the patient is safe to be discharged home without further emergent workup and can continue workup as an outpatient as needed. Discussed all findings, treatment plan as well as usual and customary return precautions.  They verbalize understanding and are comfortable with this plan.  Outpatient follow-up has been provided as needed.  All questions have been answered.    CONSULTS:  none   OUTSIDE RECORDS REVIEWED: Reviewed last internal medicine note on 07/21/2023.     FINAL CLINICAL IMPRESSION(S) / ED DIAGNOSES   Final diagnoses:  Lumbar strain, initial encounter     Rx / DC Orders   ED Discharge Orders          Ordered    oxyCODONE-acetaminophen (PERCOCET) 5-325 MG tablet  Every 8 hours PRN        09/01/23 0602    ibuprofen (ADVIL) 800 MG tablet  Every 8 hours PRN        09/01/23 0602    methocarbamol (ROBAXIN) 500 MG tablet  Every 8 hours PRN        09/01/23 0602    ondansetron (ZOFRAN-ODT) 4 MG disintegrating tablet  Every 6 hours PRN         09/01/23 0602             Note:  This document was prepared using Dragon voice recognition software and may include unintentional dictation errors.   Kyal Arts, Layla Maw, DO 09/01/23 [phone]

## 2023-09-01 NOTE — Discharge Instructions (Addendum)
You are being provided a prescription for opiates (also known as narcotics) for pain control.  Opiates can be addictive and should only be used when absolutely necessary for pain control when other alternatives do not work.  We recommend you only use them for the recommended amount of time and only as prescribed.  Please do not take with other sedative medications or alcohol.  Please do not drive, operate machinery, make important decisions while taking opiates.  Please note that these medications can be addictive and have high abuse potential.  Patients can become addicted to narcotics after only taking them for a few days.  Please keep these medications locked away from children, teenagers or any family members with history of substance abuse.  Narcotic pain medicine may also make you constipated.  You may use over-the-counter medications such as MiraLAX, Colace to prevent constipation.  If you become constipated, you may use over-the-counter enemas as needed.  Itching and nausea are also common side effects of narcotic pain medication.  If you develop uncontrolled vomiting or a rash, please stop these medications and seek medical care.   Please do not take your Robaxin and Percocet at the same time.  Both of these medications can make you very drowsy especially if taken together.  Please follow-up with your PCP if symptoms not improving.  Please return to the emergency department if you begin having numbness in your legs, weakness, difficulty holding your bowel or bladder, fever.  Please no heavy lifting over 10 pounds for the next week.  You may alternate heat and ice to your back.  Massage and gentle stretching can help with discomfort.
